# Patient Record
Sex: Female | Born: 1945 | Race: White | Hispanic: No | Smoking: Former smoker
Health system: Southern US, Community
[De-identification: ages and names within clinical notes are randomized; demographics above are authoritative.]

## PROBLEM LIST (undated history)

## (undated) DIAGNOSIS — H9201 Otalgia, right ear: Secondary | ICD-10-CM

## (undated) DIAGNOSIS — H3530 Unspecified macular degeneration: Secondary | ICD-10-CM

## (undated) DIAGNOSIS — K219 Gastro-esophageal reflux disease without esophagitis: Secondary | ICD-10-CM

## (undated) DIAGNOSIS — J45909 Unspecified asthma, uncomplicated: Secondary | ICD-10-CM

## (undated) DIAGNOSIS — R0602 Shortness of breath: Secondary | ICD-10-CM

## (undated) DIAGNOSIS — F419 Anxiety disorder, unspecified: Secondary | ICD-10-CM

## (undated) DIAGNOSIS — M3500 Sicca syndrome, unspecified: Secondary | ICD-10-CM

## (undated) DIAGNOSIS — J189 Pneumonia, unspecified organism: Secondary | ICD-10-CM

## (undated) DIAGNOSIS — F329 Major depressive disorder, single episode, unspecified: Secondary | ICD-10-CM

## (undated) DIAGNOSIS — M329 Systemic lupus erythematosus, unspecified: Secondary | ICD-10-CM

## (undated) DIAGNOSIS — J309 Allergic rhinitis, unspecified: Secondary | ICD-10-CM

## (undated) DIAGNOSIS — H269 Unspecified cataract: Secondary | ICD-10-CM

## (undated) DIAGNOSIS — F32A Depression, unspecified: Secondary | ICD-10-CM

## (undated) HISTORY — PX: NO PAST SURGERIES: SHX2092

## (undated) HISTORY — DX: Allergic rhinitis, unspecified: J30.9

## (undated) HISTORY — DX: Unspecified asthma, uncomplicated: J45.909

## (undated) HISTORY — DX: Otalgia, right ear: H92.01

---

## 1998-06-12 ENCOUNTER — Other Ambulatory Visit: Admission: RE | Admit: 1998-06-12 | Discharge: 1998-06-12 | Payer: Self-pay | Admitting: Family Medicine

## 1999-10-05 ENCOUNTER — Encounter: Admission: RE | Admit: 1999-10-05 | Discharge: 1999-10-05 | Payer: Self-pay | Admitting: Family Medicine

## 1999-10-05 ENCOUNTER — Encounter: Payer: Self-pay | Admitting: Family Medicine

## 1999-10-15 ENCOUNTER — Encounter: Admission: RE | Admit: 1999-10-15 | Discharge: 1999-10-15 | Payer: Self-pay | Admitting: Family Medicine

## 1999-10-15 ENCOUNTER — Encounter: Payer: Self-pay | Admitting: Family Medicine

## 2000-10-17 ENCOUNTER — Encounter: Payer: Self-pay | Admitting: Family Medicine

## 2000-10-17 ENCOUNTER — Encounter: Admission: RE | Admit: 2000-10-17 | Discharge: 2000-10-17 | Payer: Self-pay | Admitting: Family Medicine

## 2002-01-20 ENCOUNTER — Ambulatory Visit (HOSPITAL_COMMUNITY): Admission: RE | Admit: 2002-01-20 | Discharge: 2002-01-20 | Payer: Self-pay | Admitting: Gastroenterology

## 2004-05-03 ENCOUNTER — Ambulatory Visit: Payer: Self-pay | Admitting: Internal Medicine

## 2004-05-09 ENCOUNTER — Ambulatory Visit: Payer: Self-pay | Admitting: Internal Medicine

## 2004-05-22 ENCOUNTER — Ambulatory Visit: Payer: Self-pay | Admitting: Internal Medicine

## 2004-05-31 ENCOUNTER — Ambulatory Visit: Payer: Self-pay | Admitting: Internal Medicine

## 2004-06-08 ENCOUNTER — Ambulatory Visit: Payer: Self-pay | Admitting: Internal Medicine

## 2004-06-25 ENCOUNTER — Ambulatory Visit: Payer: Self-pay | Admitting: Internal Medicine

## 2004-07-18 ENCOUNTER — Ambulatory Visit: Payer: Self-pay | Admitting: Internal Medicine

## 2004-08-10 ENCOUNTER — Ambulatory Visit: Payer: Self-pay | Admitting: Internal Medicine

## 2004-08-24 ENCOUNTER — Ambulatory Visit: Payer: Self-pay | Admitting: Internal Medicine

## 2004-08-29 ENCOUNTER — Ambulatory Visit: Payer: Self-pay | Admitting: Internal Medicine

## 2004-10-17 ENCOUNTER — Ambulatory Visit: Payer: Self-pay | Admitting: Internal Medicine

## 2004-10-25 ENCOUNTER — Ambulatory Visit: Payer: Self-pay | Admitting: Internal Medicine

## 2004-11-02 ENCOUNTER — Ambulatory Visit: Payer: Self-pay | Admitting: Internal Medicine

## 2004-11-19 ENCOUNTER — Ambulatory Visit: Payer: Self-pay | Admitting: Internal Medicine

## 2004-11-30 ENCOUNTER — Ambulatory Visit: Payer: Self-pay | Admitting: Internal Medicine

## 2004-12-10 ENCOUNTER — Ambulatory Visit: Payer: Self-pay | Admitting: Internal Medicine

## 2005-01-04 ENCOUNTER — Ambulatory Visit: Payer: Self-pay | Admitting: Internal Medicine

## 2005-01-10 ENCOUNTER — Ambulatory Visit: Payer: Self-pay | Admitting: Internal Medicine

## 2005-01-18 ENCOUNTER — Ambulatory Visit: Payer: Self-pay | Admitting: Internal Medicine

## 2005-02-08 ENCOUNTER — Ambulatory Visit: Payer: Self-pay | Admitting: Internal Medicine

## 2005-02-11 ENCOUNTER — Ambulatory Visit: Payer: Self-pay | Admitting: Internal Medicine

## 2005-02-13 ENCOUNTER — Ambulatory Visit: Payer: Self-pay | Admitting: Internal Medicine

## 2005-02-27 ENCOUNTER — Ambulatory Visit: Payer: Self-pay | Admitting: Internal Medicine

## 2005-03-08 ENCOUNTER — Ambulatory Visit: Payer: Self-pay | Admitting: Internal Medicine

## 2005-03-22 ENCOUNTER — Ambulatory Visit: Payer: Self-pay | Admitting: Internal Medicine

## 2005-03-29 ENCOUNTER — Ambulatory Visit: Payer: Self-pay | Admitting: Internal Medicine

## 2005-04-08 ENCOUNTER — Ambulatory Visit: Payer: Self-pay | Admitting: Internal Medicine

## 2005-04-26 ENCOUNTER — Ambulatory Visit: Payer: Self-pay | Admitting: Internal Medicine

## 2005-05-03 ENCOUNTER — Ambulatory Visit: Payer: Self-pay | Admitting: Internal Medicine

## 2005-05-10 ENCOUNTER — Ambulatory Visit: Payer: Self-pay | Admitting: Internal Medicine

## 2005-05-17 ENCOUNTER — Ambulatory Visit: Payer: Self-pay | Admitting: Internal Medicine

## 2005-05-29 ENCOUNTER — Ambulatory Visit: Payer: Self-pay | Admitting: Internal Medicine

## 2005-06-13 ENCOUNTER — Ambulatory Visit: Payer: Self-pay | Admitting: Internal Medicine

## 2005-06-28 ENCOUNTER — Ambulatory Visit: Payer: Self-pay | Admitting: Internal Medicine

## 2005-07-11 ENCOUNTER — Ambulatory Visit: Payer: Self-pay | Admitting: Internal Medicine

## 2005-07-25 ENCOUNTER — Ambulatory Visit: Payer: Self-pay | Admitting: Internal Medicine

## 2005-07-29 ENCOUNTER — Ambulatory Visit: Payer: Self-pay | Admitting: Internal Medicine

## 2005-07-31 ENCOUNTER — Ambulatory Visit: Payer: Self-pay | Admitting: Internal Medicine

## 2005-08-05 ENCOUNTER — Ambulatory Visit: Payer: Self-pay | Admitting: Internal Medicine

## 2005-08-29 ENCOUNTER — Ambulatory Visit: Payer: Self-pay | Admitting: Internal Medicine

## 2005-09-13 ENCOUNTER — Ambulatory Visit: Payer: Self-pay | Admitting: Internal Medicine

## 2005-10-15 ENCOUNTER — Ambulatory Visit: Payer: Self-pay | Admitting: Internal Medicine

## 2005-10-29 ENCOUNTER — Ambulatory Visit: Payer: Self-pay | Admitting: Internal Medicine

## 2005-11-05 ENCOUNTER — Ambulatory Visit: Payer: Self-pay | Admitting: Internal Medicine

## 2005-11-26 ENCOUNTER — Ambulatory Visit: Payer: Self-pay | Admitting: Internal Medicine

## 2005-12-11 ENCOUNTER — Ambulatory Visit: Payer: Self-pay | Admitting: Internal Medicine

## 2005-12-17 ENCOUNTER — Ambulatory Visit: Payer: Self-pay | Admitting: Internal Medicine

## 2005-12-26 ENCOUNTER — Ambulatory Visit: Payer: Self-pay | Admitting: Internal Medicine

## 2006-01-17 ENCOUNTER — Ambulatory Visit: Payer: Self-pay | Admitting: Internal Medicine

## 2006-01-31 ENCOUNTER — Ambulatory Visit: Payer: Self-pay | Admitting: Internal Medicine

## 2006-02-17 ENCOUNTER — Ambulatory Visit: Payer: Self-pay | Admitting: Internal Medicine

## 2006-02-25 ENCOUNTER — Ambulatory Visit: Payer: Self-pay | Admitting: Internal Medicine

## 2006-02-26 ENCOUNTER — Ambulatory Visit: Payer: Self-pay | Admitting: Internal Medicine

## 2006-03-10 ENCOUNTER — Ambulatory Visit: Payer: Self-pay | Admitting: Internal Medicine

## 2006-03-26 ENCOUNTER — Ambulatory Visit: Payer: Self-pay | Admitting: Internal Medicine

## 2006-04-11 ENCOUNTER — Ambulatory Visit: Payer: Self-pay | Admitting: Internal Medicine

## 2006-05-09 ENCOUNTER — Ambulatory Visit: Payer: Self-pay | Admitting: Internal Medicine

## 2006-05-21 ENCOUNTER — Ambulatory Visit: Payer: Self-pay | Admitting: Internal Medicine

## 2006-06-05 ENCOUNTER — Ambulatory Visit: Payer: Self-pay | Admitting: Internal Medicine

## 2006-07-04 ENCOUNTER — Ambulatory Visit: Payer: Self-pay | Admitting: Internal Medicine

## 2006-07-10 ENCOUNTER — Ambulatory Visit: Payer: Self-pay | Admitting: Internal Medicine

## 2006-08-01 ENCOUNTER — Ambulatory Visit: Payer: Self-pay | Admitting: Internal Medicine

## 2006-08-08 ENCOUNTER — Ambulatory Visit: Payer: Self-pay | Admitting: Internal Medicine

## 2006-09-12 ENCOUNTER — Ambulatory Visit: Payer: Self-pay | Admitting: Internal Medicine

## 2006-09-19 ENCOUNTER — Ambulatory Visit: Payer: Self-pay | Admitting: Internal Medicine

## 2006-09-24 ENCOUNTER — Ambulatory Visit: Payer: Self-pay | Admitting: Internal Medicine

## 2006-10-06 ENCOUNTER — Ambulatory Visit: Payer: Self-pay | Admitting: Internal Medicine

## 2006-10-16 ENCOUNTER — Ambulatory Visit: Payer: Self-pay | Admitting: Internal Medicine

## 2006-10-23 ENCOUNTER — Ambulatory Visit: Payer: Self-pay | Admitting: Internal Medicine

## 2006-10-27 ENCOUNTER — Ambulatory Visit: Payer: Self-pay | Admitting: Internal Medicine

## 2006-10-29 ENCOUNTER — Ambulatory Visit: Payer: Self-pay | Admitting: Internal Medicine

## 2006-11-11 ENCOUNTER — Ambulatory Visit: Payer: Self-pay | Admitting: Internal Medicine

## 2006-11-11 ENCOUNTER — Encounter: Admission: RE | Admit: 2006-11-11 | Discharge: 2006-11-11 | Payer: Self-pay | Admitting: Family Medicine

## 2006-12-02 ENCOUNTER — Ambulatory Visit: Payer: Self-pay | Admitting: Internal Medicine

## 2006-12-10 ENCOUNTER — Ambulatory Visit: Payer: Self-pay | Admitting: Internal Medicine

## 2006-12-19 ENCOUNTER — Ambulatory Visit: Payer: Self-pay | Admitting: Internal Medicine

## 2007-01-02 ENCOUNTER — Ambulatory Visit: Payer: Self-pay | Admitting: Internal Medicine

## 2007-01-23 ENCOUNTER — Ambulatory Visit: Payer: Self-pay | Admitting: Internal Medicine

## 2007-01-30 ENCOUNTER — Ambulatory Visit: Payer: Self-pay | Admitting: Internal Medicine

## 2007-01-30 ENCOUNTER — Ambulatory Visit: Payer: Self-pay | Admitting: Pulmonary Disease

## 2007-02-27 ENCOUNTER — Ambulatory Visit: Payer: Self-pay | Admitting: Internal Medicine

## 2007-03-09 ENCOUNTER — Ambulatory Visit: Payer: Self-pay | Admitting: Internal Medicine

## 2007-03-27 ENCOUNTER — Ambulatory Visit: Payer: Self-pay | Admitting: Internal Medicine

## 2007-04-01 ENCOUNTER — Ambulatory Visit: Payer: Self-pay | Admitting: Internal Medicine

## 2007-04-13 ENCOUNTER — Ambulatory Visit: Payer: Self-pay | Admitting: Internal Medicine

## 2007-04-22 ENCOUNTER — Ambulatory Visit: Payer: Self-pay | Admitting: Internal Medicine

## 2007-05-08 ENCOUNTER — Ambulatory Visit: Payer: Self-pay | Admitting: Internal Medicine

## 2007-05-15 ENCOUNTER — Ambulatory Visit: Payer: Self-pay | Admitting: Internal Medicine

## 2007-05-25 ENCOUNTER — Ambulatory Visit: Payer: Self-pay | Admitting: Internal Medicine

## 2007-05-26 ENCOUNTER — Ambulatory Visit: Payer: Self-pay | Admitting: Internal Medicine

## 2007-06-12 ENCOUNTER — Ambulatory Visit: Payer: Self-pay | Admitting: Internal Medicine

## 2007-07-03 ENCOUNTER — Ambulatory Visit: Payer: Self-pay | Admitting: Internal Medicine

## 2007-08-10 ENCOUNTER — Ambulatory Visit: Payer: Self-pay | Admitting: Internal Medicine

## 2007-08-21 ENCOUNTER — Ambulatory Visit: Payer: Self-pay | Admitting: Internal Medicine

## 2007-09-03 ENCOUNTER — Ambulatory Visit: Payer: Self-pay | Admitting: Internal Medicine

## 2007-10-05 ENCOUNTER — Ambulatory Visit: Payer: Self-pay | Admitting: Internal Medicine

## 2007-10-12 DIAGNOSIS — J309 Allergic rhinitis, unspecified: Secondary | ICD-10-CM | POA: Insufficient documentation

## 2007-10-12 DIAGNOSIS — J45909 Unspecified asthma, uncomplicated: Secondary | ICD-10-CM

## 2007-10-13 ENCOUNTER — Ambulatory Visit: Payer: Self-pay | Admitting: Internal Medicine

## 2007-10-22 ENCOUNTER — Ambulatory Visit: Payer: Self-pay | Admitting: Internal Medicine

## 2007-10-27 ENCOUNTER — Ambulatory Visit: Payer: Self-pay | Admitting: Internal Medicine

## 2007-11-06 ENCOUNTER — Ambulatory Visit: Payer: Self-pay | Admitting: Internal Medicine

## 2007-11-10 ENCOUNTER — Ambulatory Visit: Payer: Self-pay | Admitting: Internal Medicine

## 2007-11-17 ENCOUNTER — Ambulatory Visit: Payer: Self-pay | Admitting: Internal Medicine

## 2007-11-24 ENCOUNTER — Ambulatory Visit: Payer: Self-pay | Admitting: Internal Medicine

## 2007-12-03 ENCOUNTER — Ambulatory Visit: Payer: Self-pay | Admitting: Internal Medicine

## 2007-12-10 ENCOUNTER — Ambulatory Visit: Payer: Self-pay | Admitting: Internal Medicine

## 2007-12-25 ENCOUNTER — Ambulatory Visit: Payer: Self-pay | Admitting: Internal Medicine

## 2007-12-31 ENCOUNTER — Ambulatory Visit: Payer: Self-pay | Admitting: Internal Medicine

## 2008-01-01 ENCOUNTER — Ambulatory Visit: Payer: Self-pay | Admitting: Internal Medicine

## 2008-01-08 ENCOUNTER — Ambulatory Visit: Payer: Self-pay | Admitting: Internal Medicine

## 2008-01-14 ENCOUNTER — Ambulatory Visit: Payer: Self-pay | Admitting: Internal Medicine

## 2008-01-15 ENCOUNTER — Ambulatory Visit: Payer: Self-pay | Admitting: Pulmonary Disease

## 2008-01-19 ENCOUNTER — Ambulatory Visit: Payer: Self-pay | Admitting: Internal Medicine

## 2008-01-26 ENCOUNTER — Ambulatory Visit: Payer: Self-pay | Admitting: Internal Medicine

## 2008-01-27 ENCOUNTER — Ambulatory Visit: Payer: Self-pay | Admitting: Internal Medicine

## 2008-02-03 ENCOUNTER — Ambulatory Visit: Payer: Self-pay | Admitting: Internal Medicine

## 2008-02-11 ENCOUNTER — Ambulatory Visit: Payer: Self-pay | Admitting: Internal Medicine

## 2008-02-18 ENCOUNTER — Ambulatory Visit: Payer: Self-pay | Admitting: Internal Medicine

## 2008-02-25 ENCOUNTER — Ambulatory Visit: Payer: Self-pay | Admitting: Internal Medicine

## 2008-03-10 ENCOUNTER — Ambulatory Visit: Payer: Self-pay | Admitting: Internal Medicine

## 2008-03-24 ENCOUNTER — Ambulatory Visit: Payer: Self-pay | Admitting: Internal Medicine

## 2008-03-28 ENCOUNTER — Ambulatory Visit: Payer: Self-pay | Admitting: Internal Medicine

## 2008-03-29 ENCOUNTER — Ambulatory Visit: Payer: Self-pay | Admitting: Internal Medicine

## 2008-03-29 DIAGNOSIS — J069 Acute upper respiratory infection, unspecified: Secondary | ICD-10-CM | POA: Insufficient documentation

## 2008-04-07 ENCOUNTER — Ambulatory Visit: Payer: Self-pay | Admitting: Internal Medicine

## 2008-04-13 ENCOUNTER — Ambulatory Visit: Payer: Self-pay | Admitting: Internal Medicine

## 2008-04-19 ENCOUNTER — Ambulatory Visit: Payer: Self-pay | Admitting: Internal Medicine

## 2008-04-28 ENCOUNTER — Ambulatory Visit: Payer: Self-pay | Admitting: Internal Medicine

## 2008-05-10 ENCOUNTER — Ambulatory Visit: Payer: Self-pay | Admitting: Internal Medicine

## 2008-05-11 ENCOUNTER — Ambulatory Visit: Payer: Self-pay | Admitting: Internal Medicine

## 2008-05-19 ENCOUNTER — Ambulatory Visit: Payer: Self-pay | Admitting: Internal Medicine

## 2008-05-26 ENCOUNTER — Ambulatory Visit: Payer: Self-pay | Admitting: Internal Medicine

## 2008-06-07 ENCOUNTER — Ambulatory Visit: Payer: Self-pay | Admitting: Internal Medicine

## 2008-06-14 ENCOUNTER — Ambulatory Visit: Payer: Self-pay | Admitting: Internal Medicine

## 2008-06-20 ENCOUNTER — Ambulatory Visit: Payer: Self-pay | Admitting: Internal Medicine

## 2008-07-05 ENCOUNTER — Ambulatory Visit: Payer: Self-pay | Admitting: Internal Medicine

## 2008-07-26 ENCOUNTER — Ambulatory Visit: Payer: Self-pay | Admitting: Internal Medicine

## 2008-08-04 ENCOUNTER — Ambulatory Visit: Payer: Self-pay | Admitting: Internal Medicine

## 2008-08-19 ENCOUNTER — Ambulatory Visit: Payer: Self-pay | Admitting: Internal Medicine

## 2008-09-08 ENCOUNTER — Ambulatory Visit: Payer: Self-pay | Admitting: Internal Medicine

## 2008-09-13 ENCOUNTER — Ambulatory Visit: Payer: Self-pay | Admitting: Internal Medicine

## 2008-09-22 ENCOUNTER — Ambulatory Visit: Payer: Self-pay | Admitting: Internal Medicine

## 2008-10-04 ENCOUNTER — Ambulatory Visit: Payer: Self-pay | Admitting: Internal Medicine

## 2008-10-10 ENCOUNTER — Ambulatory Visit: Payer: Self-pay | Admitting: Internal Medicine

## 2008-10-10 DIAGNOSIS — M25519 Pain in unspecified shoulder: Secondary | ICD-10-CM

## 2008-10-20 ENCOUNTER — Ambulatory Visit: Payer: Self-pay | Admitting: Internal Medicine

## 2008-10-25 ENCOUNTER — Ambulatory Visit: Payer: Self-pay | Admitting: Internal Medicine

## 2008-10-28 ENCOUNTER — Ambulatory Visit: Payer: Self-pay | Admitting: Internal Medicine

## 2008-11-08 ENCOUNTER — Ambulatory Visit: Payer: Self-pay | Admitting: Internal Medicine

## 2008-11-16 ENCOUNTER — Ambulatory Visit: Payer: Self-pay | Admitting: Internal Medicine

## 2008-11-22 ENCOUNTER — Ambulatory Visit: Payer: Self-pay | Admitting: Internal Medicine

## 2008-11-30 ENCOUNTER — Ambulatory Visit: Payer: Self-pay | Admitting: Internal Medicine

## 2008-12-08 ENCOUNTER — Ambulatory Visit: Payer: Self-pay | Admitting: Internal Medicine

## 2008-12-21 ENCOUNTER — Ambulatory Visit: Payer: Self-pay | Admitting: Internal Medicine

## 2008-12-29 ENCOUNTER — Ambulatory Visit: Payer: Self-pay | Admitting: Internal Medicine

## 2009-02-03 ENCOUNTER — Ambulatory Visit: Payer: Self-pay | Admitting: Internal Medicine

## 2009-02-10 ENCOUNTER — Ambulatory Visit: Payer: Self-pay | Admitting: Internal Medicine

## 2009-02-15 ENCOUNTER — Ambulatory Visit: Payer: Self-pay | Admitting: Internal Medicine

## 2009-02-28 ENCOUNTER — Ambulatory Visit: Payer: Self-pay | Admitting: Internal Medicine

## 2009-03-08 ENCOUNTER — Ambulatory Visit: Payer: Self-pay | Admitting: Internal Medicine

## 2009-03-28 ENCOUNTER — Ambulatory Visit: Payer: Self-pay | Admitting: Internal Medicine

## 2009-04-05 ENCOUNTER — Ambulatory Visit: Payer: Self-pay | Admitting: Internal Medicine

## 2009-04-27 ENCOUNTER — Ambulatory Visit: Payer: Self-pay | Admitting: Internal Medicine

## 2009-04-28 ENCOUNTER — Ambulatory Visit: Payer: Self-pay | Admitting: Internal Medicine

## 2009-05-09 ENCOUNTER — Ambulatory Visit: Payer: Self-pay | Admitting: Internal Medicine

## 2009-05-18 ENCOUNTER — Ambulatory Visit: Payer: Self-pay | Admitting: Internal Medicine

## 2009-05-30 ENCOUNTER — Ambulatory Visit: Payer: Self-pay | Admitting: Internal Medicine

## 2009-06-12 ENCOUNTER — Ambulatory Visit: Payer: Self-pay | Admitting: Internal Medicine

## 2009-06-30 ENCOUNTER — Ambulatory Visit: Payer: Self-pay | Admitting: Internal Medicine

## 2009-07-12 ENCOUNTER — Ambulatory Visit: Payer: Self-pay | Admitting: Internal Medicine

## 2009-07-21 ENCOUNTER — Ambulatory Visit: Payer: Self-pay | Admitting: Internal Medicine

## 2009-08-02 ENCOUNTER — Ambulatory Visit: Payer: Self-pay | Admitting: Internal Medicine

## 2009-08-07 ENCOUNTER — Ambulatory Visit: Payer: Self-pay | Admitting: Internal Medicine

## 2009-08-23 ENCOUNTER — Ambulatory Visit: Payer: Self-pay | Admitting: Internal Medicine

## 2009-08-31 ENCOUNTER — Ambulatory Visit: Payer: Self-pay | Admitting: Internal Medicine

## 2009-09-14 ENCOUNTER — Ambulatory Visit: Payer: Self-pay | Admitting: Internal Medicine

## 2009-09-21 ENCOUNTER — Ambulatory Visit: Payer: Self-pay | Admitting: Internal Medicine

## 2009-09-25 ENCOUNTER — Ambulatory Visit: Payer: Self-pay | Admitting: Internal Medicine

## 2009-10-04 ENCOUNTER — Ambulatory Visit: Payer: Self-pay | Admitting: Internal Medicine

## 2009-10-11 ENCOUNTER — Telehealth: Payer: Self-pay | Admitting: Internal Medicine

## 2009-10-13 ENCOUNTER — Ambulatory Visit: Payer: Self-pay | Admitting: Internal Medicine

## 2009-10-16 ENCOUNTER — Ambulatory Visit: Payer: Self-pay | Admitting: Internal Medicine

## 2009-10-16 DIAGNOSIS — H9209 Otalgia, unspecified ear: Secondary | ICD-10-CM | POA: Insufficient documentation

## 2009-10-17 ENCOUNTER — Ambulatory Visit: Payer: Self-pay | Admitting: Internal Medicine

## 2009-10-25 ENCOUNTER — Encounter: Admission: RE | Admit: 2009-10-25 | Discharge: 2009-10-25 | Payer: Self-pay | Admitting: Family Medicine

## 2009-10-25 ENCOUNTER — Ambulatory Visit: Payer: Self-pay | Admitting: Internal Medicine

## 2009-11-07 ENCOUNTER — Ambulatory Visit: Payer: Self-pay | Admitting: Internal Medicine

## 2009-11-21 ENCOUNTER — Ambulatory Visit: Payer: Self-pay | Admitting: Internal Medicine

## 2009-12-01 ENCOUNTER — Ambulatory Visit: Payer: Self-pay | Admitting: Internal Medicine

## 2009-12-07 ENCOUNTER — Ambulatory Visit: Payer: Self-pay | Admitting: Internal Medicine

## 2009-12-14 ENCOUNTER — Ambulatory Visit: Payer: Self-pay | Admitting: Internal Medicine

## 2009-12-26 ENCOUNTER — Ambulatory Visit: Payer: Self-pay | Admitting: Internal Medicine

## 2010-01-09 ENCOUNTER — Ambulatory Visit: Payer: Self-pay | Admitting: Internal Medicine

## 2010-01-29 ENCOUNTER — Ambulatory Visit: Payer: Self-pay | Admitting: Internal Medicine

## 2010-02-09 ENCOUNTER — Ambulatory Visit: Payer: Self-pay | Admitting: Internal Medicine

## 2010-02-14 ENCOUNTER — Ambulatory Visit: Payer: Self-pay | Admitting: Internal Medicine

## 2010-02-20 ENCOUNTER — Ambulatory Visit: Payer: Self-pay | Admitting: Internal Medicine

## 2010-02-27 ENCOUNTER — Ambulatory Visit: Payer: Self-pay | Admitting: Internal Medicine

## 2010-03-06 ENCOUNTER — Ambulatory Visit: Payer: Self-pay | Admitting: Internal Medicine

## 2010-03-12 ENCOUNTER — Ambulatory Visit: Payer: Self-pay | Admitting: Internal Medicine

## 2010-03-22 ENCOUNTER — Ambulatory Visit: Payer: Self-pay | Admitting: Internal Medicine

## 2010-04-02 ENCOUNTER — Ambulatory Visit: Payer: Self-pay | Admitting: Internal Medicine

## 2010-05-10 ENCOUNTER — Ambulatory Visit: Payer: Self-pay | Admitting: Internal Medicine

## 2010-05-13 ENCOUNTER — Encounter: Payer: Self-pay | Admitting: Family Medicine

## 2010-05-15 ENCOUNTER — Ambulatory Visit: Payer: Self-pay | Admitting: Internal Medicine

## 2010-05-22 ENCOUNTER — Ambulatory Visit: Payer: Self-pay | Admitting: Internal Medicine

## 2010-05-22 NOTE — Assessment & Plan Note (Signed)
Summary: FLU SHOT/[facility]   Nurse Visit   Allergies: No Known Drug Allergies  Orders Added: 1)  Admin 1st Vaccine [90471] 2)  Flu Vaccine 100yrs + [98119] Flu Vaccine Consent Questions     Do you have a history of severe allergic reactions to this vaccine? no    Any prior history of allergic reactions to egg and/or gelatin? no    Do you have a sensitivity to the preservative Thimersol? no    Do you have a past history of Guillan-Barre Syndrome? no    Do you currently have an acute febrile illness? no    Have you ever had a severe reaction to latex? no    Vaccine information given and explained to patient? yes    Are you currently pregnant? no    Lot Number:[id number]   Exp Date:10/20/2010   Site Given  Left Deltoid IM  Tammy Scott  February 14, 2010 11:08 AM

## 2010-05-22 NOTE — Miscellaneous (Signed)
Summary: Injection Record / [facility]    Injection Record / [facility]    Imported By: Lennie Odor 12/22/2009 10:00:12  _____________________________________________________________________  External Attachment:    Type:   Image     Comment:   External Document

## 2010-05-22 NOTE — Miscellaneous (Signed)
Summary: Injection Record/[facility]  Injection Record/[facility]   Imported By: Sherian Rein 09/12/2009 13:32:58  _____________________________________________________________________  External Attachment:    Type:   Image     Comment:   External Document

## 2010-05-22 NOTE — Progress Notes (Signed)
Summary: nos appt  Phone Note Call from Patient   Caller: [email or web address]  Call For: young Summary of Call: In ref to nos from 6/21, pt states she will rsc when she comes in for her zolir. Initial call taken by: Darletta Moll,  October 11, 2009 2:19 PM

## 2010-05-22 NOTE — Consult Note (Signed)
Summary: [facility]  [facility]   Imported By: Sherian Rein 11/15/2009 09:11:41  _____________________________________________________________________  External Attachment:    Type:   Image     Comment:   External Document

## 2010-05-22 NOTE — Assessment & Plan Note (Signed)
Summary: rov/ m[facility]   Primary Provider/Referring Provider:  [facility]  CC:  follow up visit-Pain in Right Ear"getting more frequent"..  History of Present Illness: 6/23/009-  65 year old woman returning for follow-up of allergic rhinitis and asthma.  Naproxen has helped her cough.  This has been a good year with no special problems.  Rare need for her rescue inhaler.  Allergy vaccine has done well. Denies headache, sinus drainage, sneezing chest pain, dyspnea, n/v/d, weight loss, fever, edema.   January 27, 2008--Complains of 2 days of right ear pain, slight headache. mild nasal congestion/drainage. Denies chest pain, dyspnea, orthopnea, hemoptysis, fever, n/v/d, edema. Had recent tooth break in bottom right. Dentist early next month.   March 29, 2008--complains of 2 weeks of cough, congesiton, nasal drip, malaise. Denies chest pain, dyspnea, orthopnea, hemoptysis, fever, n/v/d, edema, recent travel , body aches.   10/10/08- Allergic rhinitis, asthma Breathing now normal for her after rhinitis and bronchitis last winter- fully resolved Allergy- got through Spring pollen with adequate control. Continues allergy vaccine here without problems. Asthma- Has not needed rescue inhaler at all, as she continues Advair.  October 16, 2009- Allergic rhinitis, asthma Reports pain in right ear. She remembers it last year around this time, but more this year. She denies hearing loss, but it is in family. Denies tinnitus. Comes and goes- dull pain, relieved by aspirin, happens once every 2-3 weeks. denies popping. Has broken tooth on that side, xrayed and checked by dentist. Otherwise she says she has been fine. Allergy shots doing well AT 1:10. Continues Advair and has used rescue inhaler only once in several years.    Asthma History    Initial Asthma Severity Rating:    Age range: 12+ years    Symptoms: 0-2 days/week    Nighttime Awakenings: 0-2/month    Interferes w/ normal activity: no limitations  SABA use (not for EIB): 0-2 days/week    Asthma Severity Assessment: Intermittent   Preventive Screening-Counseling & Management  Alcohol-Tobacco     Smoking Status: quit     Year Quit: 2001     Tobacco Counseling: to remain off tobacco products  Current Medications (verified): 1)  Advair Diskus 100-50 Mcg/dose  Misc (Fluticasone-Salmeterol) .... Use One Puff Two Times A Day   Rinse Mouth After Each Use 2)  Zoloft 50 Mg Tabs (Sertraline Hcl) .... Take One Tablet Daily. 3)  Flax Seed Oil 1000 Mg Caps (Flaxseed (Linseed)) .... Take 1 By Mouth Once Daily 4)  Proair Hfa 108 (90 Base) Mcg/act  Aers (Albuterol Sulfate) .... As Needed 5)  Nexium 40 Mg  Cpdr (Esomeprazole Magnesium) .... Take 1 By Mouth Once Daily 6)  Allergy Vaccine 1:10 Gh .... Once Weekly 7)  Vitamin D 1000 Unit Tabs (Cholecalciferol) .... Take 1 By Mouth Once Daily 8)  Align  Caps (Probiotic Product) .... Take 1 By Mouth Once Daily  Allergies (verified): No Known Drug Allergies  Past History:  Past Surgical History: Last updated: 10/10/2008 None  Family History: Last updated: 10/13/2007 Mother-living age 42; heart disease Father- deceased age 80; staph infection following heart surgery Brother- living age 16; heart disease Sister- living age 33  Social History: Last updated: 10/13/2007 Retired Patient states former smoker.  quit 2000 ETOH-occasionally Divorced; no children  Risk Factors: Smoking Status: quit (10/16/2009)  Past Medical History: ASTHMA (ICD-493.90) ALLERGIC RHINITIS (ICD-477.9) Ear pain R  Review of Systems      See HPI  The patient denies shortness of breath with activity, shortness  of breath at rest, productive cough, non-productive cough, coughing up blood, chest pain, irregular heartbeats, acid heartburn, indigestion, loss of appetite, weight change, abdominal pain, difficulty swallowing, sore throat, tooth/dental problems, headaches, nasal congestion/difficulty breathing through  nose, and sneezing.    Vital Signs:  Patient profile:   65 year old female Height:      62 inches Weight:      166 pounds BMI:     30.47 O2 Sat:      95 % on Room air Pulse rate:   92 / minute BP sitting:   130 / 90  (left arm) Cuff size:   regular  Vitals Entered By: Reynaldo Minium CMA (October 16, 2009 9:48 AM)  O2 Flow:  Room air CC: follow up visit-Pain in Right Ear"getting more frequent".   Physical Exam  Additional Exam:  General: A/Ox3; pleasant and cooperative, NAD,  SKIN: no rash, lesions NODES: no lymphadenopathy HEENT: [place]/AT, EOM- WNL, Conjuctivae- clear, PERRLA, TM-WNL, Nose- clear, Throat- clear and wnl, Mallampati III NECK: Supple w/ fair ROM, JVD- none, normal carotid impulses w/o bruits Thyroid- normal to palpation CHEST: Clear to P&A HEART: RRR, no m/g/r heard ABDOMEN: Soft and nl; nml bowel sounds; no organomegaly or masses noted ZOX:WRUE, nl pulses, no edema, no obvious crepitus on light passive movement of left arm at shoulder. NEURO: Grossly intact to observation      Impression & Recommendations:  Problem # 1:  ASTHMA (ICD-493.90) Good control with Advair and allergy vaccine, with virtuallly no need for rescue inhaler  Problem # 2:  ALLERGIC RHINITIS (ICD-477.9)  has done very well with allergy vaccine.  Orders: Est. Patient Level IV (45409)  Problem # 3:  EAR PAIN, RIGHT (ICD-388.70)  We can't get hx to suggest any relation to teeth, jaw or sinuses. She blows her nose more in the spring, which is when her ear seems most likely to bother her. Exam is unremarkable except that she doesnt open jaw very wide, which doesn't hurt but she says has gotten worse as she ages We will send her back to Dr Annalee Genta. Question status of her right TM joint.  Medications Added to Medication List This Visit: 1)  Flax Seed Oil 1000 Mg Caps (Flaxseed (linseed)) .... Take 1 by mouth once daily 2)  Vitamin D 1000 Unit Tabs (Cholecalciferol) .... Take 1 by mouth once  daily 3)  Align Caps (Probiotic product) .... Take 1 by mouth once daily  Other Orders: ENT Referral (ENT)  Patient Instructions: 1)  Please schedule a follow-up appointment in 1 year. 2)  See [facility] for help with referral back to Dr Annalee Genta about your tight jaw and ear pain. 3)  Continue your present treament otherwise- call if needed. Prescriptions: PROAIR HFA 108 (90 BASE) MCG/ACT  AERS (ALBUTEROL SULFATE) as needed  #1 x prn   Entered and Authorized by:   Waymon Budge MD   Signed by:   Waymon Budge MD on 10/16/2009   Method used:   Print then Give to Patient   RxID:   [id number] ADVAIR DISKUS 100-50 MCG/DOSE  MISC (FLUTICASONE-SALMETEROL) use one puff two times a day   rinse mouth after each use  #1 x prn   Entered and Authorized by:   Waymon Budge MD   Signed by:   Waymon Budge MD on 10/16/2009   Method used:   Print then Give to Patient   RxID:   [id number]

## 2010-05-24 NOTE — Miscellaneous (Signed)
Summary: Injection record  Injection record   Imported By: Lester [place] 05/04/2010 12:20:05  _____________________________________________________________________  External Attachment:    Type:   Image     Comment:   External Document

## 2010-05-30 DIAGNOSIS — J301 Allergic rhinitis due to pollen: Secondary | ICD-10-CM

## 2010-06-12 ENCOUNTER — Ambulatory Visit (INDEPENDENT_AMBULATORY_CARE_PROVIDER_SITE_OTHER): Payer: BC Managed Care – PPO

## 2010-06-12 DIAGNOSIS — J301 Allergic rhinitis due to pollen: Secondary | ICD-10-CM

## 2010-06-22 ENCOUNTER — Ambulatory Visit (INDEPENDENT_AMBULATORY_CARE_PROVIDER_SITE_OTHER): Payer: BC Managed Care – PPO

## 2010-06-22 ENCOUNTER — Encounter: Payer: Self-pay | Admitting: Internal Medicine

## 2010-06-22 DIAGNOSIS — J301 Allergic rhinitis due to pollen: Secondary | ICD-10-CM

## 2010-06-28 NOTE — Assessment & Plan Note (Signed)
Summary: ALLERGY/[facility]   Nurse Visit   Allergies: No Known Drug Allergies  Orders Added: 1)  Allergy Injection (1) [95115] 

## 2010-07-10 ENCOUNTER — Telehealth: Payer: Self-pay | Admitting: Internal Medicine

## 2010-07-10 ENCOUNTER — Ambulatory Visit (INDEPENDENT_AMBULATORY_CARE_PROVIDER_SITE_OTHER): Payer: BC Managed Care – PPO

## 2010-07-10 DIAGNOSIS — J301 Allergic rhinitis due to pollen: Secondary | ICD-10-CM

## 2010-07-10 NOTE — Telephone Encounter (Signed)
LMOMTCB

## 2010-07-11 MED ORDER — AZITHROMYCIN 500 MG PO TABS: ORAL_TABLET | ORAL | Status: DC

## 2010-07-11 NOTE — Telephone Encounter (Signed)
Suggest Z pak 

## 2010-07-11 NOTE — Telephone Encounter (Signed)
Spoke with pt and advised that per Dr Maple Hudson we will send zpack to her pharmacy. She verbalized understanding. Rx was sent to [facility] [address].

## 2010-07-11 NOTE — Telephone Encounter (Signed)
Pt called back and she c/o sore throat, post nasal drip, has some head congestion, sneezing, dry cough x 2 days. Pt denies any fever. Pt has been taking benadryl but has had no relief. Pt would like an rx called into [facility] [address]. Dr. Maple Hudson please advise thanks  Pt has KNDA Carver Fila, MA

## 2010-08-01 ENCOUNTER — Ambulatory Visit (INDEPENDENT_AMBULATORY_CARE_PROVIDER_SITE_OTHER): Payer: BC Managed Care – PPO

## 2010-08-01 DIAGNOSIS — J309 Allergic rhinitis, unspecified: Secondary | ICD-10-CM

## 2010-08-09 ENCOUNTER — Ambulatory Visit (INDEPENDENT_AMBULATORY_CARE_PROVIDER_SITE_OTHER): Payer: BC Managed Care – PPO

## 2010-08-09 DIAGNOSIS — J309 Allergic rhinitis, unspecified: Secondary | ICD-10-CM

## 2010-08-15 ENCOUNTER — Ambulatory Visit (INDEPENDENT_AMBULATORY_CARE_PROVIDER_SITE_OTHER): Payer: BC Managed Care – PPO

## 2010-08-15 DIAGNOSIS — J309 Allergic rhinitis, unspecified: Secondary | ICD-10-CM

## 2010-08-24 ENCOUNTER — Ambulatory Visit (INDEPENDENT_AMBULATORY_CARE_PROVIDER_SITE_OTHER): Payer: BC Managed Care – PPO

## 2010-08-24 DIAGNOSIS — J309 Allergic rhinitis, unspecified: Secondary | ICD-10-CM

## 2010-08-31 ENCOUNTER — Ambulatory Visit (INDEPENDENT_AMBULATORY_CARE_PROVIDER_SITE_OTHER): Payer: BC Managed Care – PPO

## 2010-08-31 DIAGNOSIS — J309 Allergic rhinitis, unspecified: Secondary | ICD-10-CM

## 2010-09-04 NOTE — Assessment & Plan Note (Signed)
[name]                             PULMONARY OFFICE NOTE   NAME:[name], [name] [name]                        MRN:          [id number]  DATE:01/30/2007                            DOB:          1945-12-30    HISTORY OF PRESENT ILLNESS:  The patient is a 65 year old white female  patient of Dr. Roxy [name] who has a known history of asthma and allergic  rhinitis.  Presents today for acute office visit.  Patient complains of  a 2 week history of nasal congestion, post nasal drip, productive cough  with painful coughing paroxysms.  Patient was seen at a local [facility]  1 week ago and given a Z-Pak.  Patient reports that her symptoms did  slightly improve.  She has had no further purulent sputum or fever.  Patient denies any hemoptysis, orthopnea, PND or leg swelling.   PAST MEDICAL HISTORY:  Reviewed.   CURRENT MEDICATIONS:  Reviewed.   PHYSICAL EXAMINATION:  Patient is a pleasant female in no acute  distress.  She is afebrile with stable vital signs.  O2 saturation is  97% on room air.  HEENT:  Nasal mucosa slightly pale.  Nontender sinuses.  Posterior  pharynx was clear.  NECK:  Supple without cervical adenopathy.  No JVD.  LUNGS:  Diminished breath sounds at the bases.  CARDIAC:  Regular rate.  ABDOMEN:  Soft and nontender.  EXTREMITIES:  Warm without any edema.   IMPRESSION AND PLAN:  Unresolved tracheobronchitis.   Patient is to begin Mucinex DM twice daily.  Xopenex nebulizer treatment  was given today in the office.  Short prednisone taper over the next  week.   Zyrtec 10 mg at bedtime x1 week.  Patient is to hold fish oil until  cough is resolved.  Patient is to return back with Dr. Maple [name] in 1=2  months as scheduled or sooner if needed.      Rubye Oaks, NP  Electronically Signed      [name] D. [name] Hudson, MD, Tonny Bollman, FACP  Electronically Signed   TP/MedQ  DD: 01/30/2007  DT: 01/31/2007  Job #: [id number]

## 2010-09-04 NOTE — Assessment & Plan Note (Signed)
[facility]                             PULMONARY OFFICE NOTE   NAME:[name], [name] [name]                        MRN:          [id number]  DATE:10/23/2006                            DOB:          October 25, 1945    PROBLEM:  1. Asthma.  2. Allergic rhinitis.   HISTORY:  She had what she called a summer cold around the 1st week of  June leaving her with some lingering cough, a little bit of wheeze,  scant white sputum.  No sinus pain.  Cough and the feeling of something  in her throat occasionally wake her, but she has not recognized reflux  or heartburn.  She does tend to be somewhat worse lying in bed at night.  She just finished 5 days of doxycycline for a small puncture wound on  her arm.  She continues allergy vaccine at 1:10 without problems.   MEDICATIONS:  1. Advair 100/50.  2. Zoloft.  3. Albuterol inhaler.   NO MEDICATION ALLERGY.   OBJECTIVE:  Weight 178 pounds.  BP 158/98.  Pulse regular 90.  Room air  saturation 96%.  Quiet clear chest.  No cough demonstrated.  No visible postnasal drainage or stridor.  Mild nasal stuffiness.  HEART:  Sounds regular without murmur.   IMPRESSION:  Postviral bronchitis versus reflux.   PLAN:  1. Try Naprosyn 250 mg b.i.d. to see if that will resolve the post      bronchitic bronchitis component as explained to her.  2. Schedule return in 6 months, earlier p.r.n.     Clinton D. Maple Hudson, MD, Tonny Bollman, FACP  Electronically Signed    CDY/MedQ  DD: 10/23/2006  DT: 10/23/2006  Job #: [id number]   cc:   [facility]

## 2010-09-06 ENCOUNTER — Ambulatory Visit (INDEPENDENT_AMBULATORY_CARE_PROVIDER_SITE_OTHER): Payer: BC Managed Care – PPO

## 2010-09-06 DIAGNOSIS — J309 Allergic rhinitis, unspecified: Secondary | ICD-10-CM

## 2010-09-07 NOTE — Assessment & Plan Note (Signed)
[facility]                               PULMONARY OFFICE NOTE   NAME:[name] [name], [name] [name]                        MRN:          [id number]  DATE:03/10/2006                            DOB:          24-Jan-1946    PROBLEM LIST:  1. Asthma.  2. Allergic rhinitis.   HISTORY:  One week of increased cough. She questions if she might have a  sinus infection. Coughs sometimes until she wretches but brings up only  clear sputum. Tussive rib soreness, not sleeping well, wheeze. She does not  have a sore throat but head feels congested. 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 Continues  allergy vaccine here at 1:10.   MEDICATIONS:  1. Advair 100/50.  2. Astelin.  3. Zoloft.  4. Lisinopril.  5. Albuterol inhaler.   No medication allergy.   She had not been coughing routinely but we did look at the lisinopril  question.   OBJECTIVE:  Weight 184 pounds, BP 122/86, pulse regular 100, room air  saturation 99%. Turbinate edema. Chest sounds clear. Throat is not red.  There is no adenopathy or drainage.   Chest x-ray from June 26 had shown stable mild chronic bronchitis changes  with no acute abnormality. Chemistries and labs from that time had been  remarkable only for a platelet count of 456,000.   IMPRESSION:  Bronchitis and rhinitis with exacerbation.   PLAN:  1. Nasal nebulizer treatment with Neo-Synephrine.  2. Depo-Medrol 80 mg IM.  3. Doxicycline for 7 days to hold with discussion.  4. Phenergan with codeine cough syrup 5 mL q.6h p.r.n.  5. Schedule return in 6 months, earlier p.r.n.    Clinton D. Maple Hudson, MD, Tonny Bollman, FACP  Electronically Signed   CDY/MedQ  DD: 03/11/2006  DT: 03/12/2006  Job #: [id number]

## 2010-09-07 NOTE — Op Note (Signed)
NAMEJANYLA, [name] [name]                           ACCOUNT NO.:  [id number]   MEDICAL RECORD NO.:  [id number]                   PATIENT TYPE:  AMB   LOCATION:  ENDO                                 FACILITY:  [facility]   PHYSICIAN:  Charna Elizabeth, M.D.                   DATE OF BIRTH:  09/19/1945   DATE OF PROCEDURE:  01/20/2002  DATE OF DISCHARGE:                                 OPERATIVE REPORT   PROCEDURE PERFORMED:  Screening colonoscopy.   ENDOSCOPIST:  Charna Elizabeth, M.D.   INSTRUMENT USED:  Olympus video colonoscope.   INDICATIONS FOR PROCEDURE:  The patient is a 65 year old white female  undergoing screening colonoscopy.  The patient has had some rectal bleeding  with hard stool and has a history of chronic constipation and family history  of colon cancer in a paternal uncle.  Rule out colonic polyps, masses,  hemorrhoids, etc.   PREPROCEDURE PREPARATION:  Informed consent was procured from the patient.  The patient was fasted for eight hours prior to the procedure and prepped  with a bottle of magnesium citrate and a gallon of NuLytely the night prior  to the procedure.   PREPROCEDURE PHYSICAL:  The patient had stable vital signs. Neck supple.  Chest clear to auscultation.  S1 and S2 regular.  Abdomen soft with normal  bowel sounds.   DESCRIPTION OF PROCEDURE:  The patient was placed in left lateral decubitus  position and sedated with 70 mg of Demerol and 7 mg of Versed intravenously.  Once the patient was adequately sedated and maintained on low flow oxygen  and continuous cardiac monitoring, the Olympus video colonoscope was  advanced from the rectum to the cecum and terminal ileum without difficulty.  An isolated diverticulum was seen in the left colon.  The rest of the  colonic mucosa up to the terminal ileum appeared normal.   IMPRESSION:  1. Normal colonoscopy up to the terminal ileum except for an isolated     diverticulum in the left colon.  2. No masses or polyps  seen.  3. No evidence of hemorrhoids.   RECOMMENDATIONS:  1. A high fiber diet has been discussed with the patient and dietary     brochures have been given to her for her education.  2.     Repeat colorectal cancer screening is recommended in the next five years     unless the patient develops any abnormal symptoms in the interim.  3. Outpatient follow-up in the next two weeks earlier if need be.                                                    Charna Elizabeth, M.D.    JM/MEDQ  D:  01/20/2002  T:  01/20/2002  Job:  [id number]   cc:   Teena Irani. Arlyce Dice, M.D.

## 2010-09-13 ENCOUNTER — Ambulatory Visit (INDEPENDENT_AMBULATORY_CARE_PROVIDER_SITE_OTHER): Payer: BC Managed Care – PPO

## 2010-09-13 DIAGNOSIS — J309 Allergic rhinitis, unspecified: Secondary | ICD-10-CM

## 2010-09-25 ENCOUNTER — Ambulatory Visit (INDEPENDENT_AMBULATORY_CARE_PROVIDER_SITE_OTHER): Payer: BC Managed Care – PPO

## 2010-09-25 DIAGNOSIS — J309 Allergic rhinitis, unspecified: Secondary | ICD-10-CM

## 2010-10-08 ENCOUNTER — Ambulatory Visit (INDEPENDENT_AMBULATORY_CARE_PROVIDER_SITE_OTHER): Payer: BC Managed Care – PPO

## 2010-10-08 DIAGNOSIS — J309 Allergic rhinitis, unspecified: Secondary | ICD-10-CM

## 2010-10-10 ENCOUNTER — Encounter: Payer: Self-pay | Admitting: Internal Medicine

## 2010-10-17 ENCOUNTER — Ambulatory Visit (INDEPENDENT_AMBULATORY_CARE_PROVIDER_SITE_OTHER): Payer: BC Managed Care – PPO

## 2010-10-17 DIAGNOSIS — J309 Allergic rhinitis, unspecified: Secondary | ICD-10-CM

## 2010-10-18 ENCOUNTER — Ambulatory Visit (INDEPENDENT_AMBULATORY_CARE_PROVIDER_SITE_OTHER): Payer: BC Managed Care – PPO

## 2010-10-18 DIAGNOSIS — J309 Allergic rhinitis, unspecified: Secondary | ICD-10-CM

## 2010-10-19 ENCOUNTER — Ambulatory Visit: Payer: BC Managed Care – PPO

## 2010-10-26 ENCOUNTER — Ambulatory Visit: Payer: BC Managed Care – PPO | Admitting: Internal Medicine

## 2010-11-05 ENCOUNTER — Ambulatory Visit: Payer: BC Managed Care – PPO | Admitting: Internal Medicine

## 2010-11-23 ENCOUNTER — Ambulatory Visit (INDEPENDENT_AMBULATORY_CARE_PROVIDER_SITE_OTHER): Payer: BC Managed Care – PPO

## 2010-11-23 DIAGNOSIS — J309 Allergic rhinitis, unspecified: Secondary | ICD-10-CM

## 2010-11-29 ENCOUNTER — Ambulatory Visit (INDEPENDENT_AMBULATORY_CARE_PROVIDER_SITE_OTHER): Payer: BC Managed Care – PPO

## 2010-11-29 ENCOUNTER — Ambulatory Visit (INDEPENDENT_AMBULATORY_CARE_PROVIDER_SITE_OTHER): Payer: BC Managed Care – PPO | Admitting: Internal Medicine

## 2010-11-29 ENCOUNTER — Encounter: Payer: Self-pay | Admitting: Internal Medicine

## 2010-11-29 VITALS — BP 124/80 | HR 80 | Ht 62.0 in | Wt 162.2 lb

## 2010-11-29 DIAGNOSIS — J45909 Unspecified asthma, uncomplicated: Secondary | ICD-10-CM

## 2010-11-29 DIAGNOSIS — J309 Allergic rhinitis, unspecified: Secondary | ICD-10-CM

## 2010-11-29 DIAGNOSIS — J301 Allergic rhinitis due to pollen: Secondary | ICD-10-CM

## 2010-11-29 DIAGNOSIS — H9209 Otalgia, unspecified ear: Secondary | ICD-10-CM

## 2010-11-29 MED ORDER — FLUTICASONE-SALMETEROL 100-50 MCG/DOSE IN AEPB: 1.0000 | INHALATION_SPRAY | Freq: Two times a day (BID) | RESPIRATORY_TRACT | Status: DC

## 2010-11-29 MED ORDER — ALBUTEROL SULFATE HFA 108 (90 BASE) MCG/ACT IN AERS: 2.0000 | INHALATION_SPRAY | Freq: Four times a day (QID) | RESPIRATORY_TRACT | Status: DC | PRN

## 2010-11-29 NOTE — Assessment & Plan Note (Signed)
Continues allergy vaccine. I think she would be better off using loratadine than her benadryl. Discussed and educated on this.

## 2010-11-29 NOTE — Patient Instructions (Signed)
Script printed for rescue albuterol inhaler "Proair"- ask drug store to put it on hold for you  Script sent for Advair  Continue allergy vaccine  Suggest you try otc antihistamine loratadine/ Claritin

## 2010-11-29 NOTE — Assessment & Plan Note (Signed)
Mild intermittent asthma. We reviewed meds. She will keep a rescue inhaler script at the drug store.

## 2010-11-29 NOTE — Progress Notes (Signed)
  Subjective:    Patient ID: [name] [name], female    DOB: [birth year], 65 y.o.   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 No asthma flares. Admits only nasal congestion if outside at some times of year. Occasionally will supplement with a benadryl.  Continues allergy vaccine without problems. Continues Advair bid, but never needs the rescue inhaler. Review of Systems Constitutional:   No-   weight loss, night sweats, fevers, chills, fatigue, lassitude. HEENT:   No-  headaches, difficulty swallowing, tooth/dental problems, sore throat,       No-  sneezing, itching, ear ache,           + nasal congestion, post nasal drip,  CV:  No-   chest pain, orthopnea, PND, swelling in lower extremities, anasarca, dizziness, palpitations Resp: No-   shortness of breath with exertion or at rest.              No-   productive cough,  No non-productive cough,  No-  coughing up of blood.              No-   change in color of mucus.  No- wheezing.   Skin: No-   rash or lesions. GI:  No-   heartburn, indigestion, abdominal pain, nausea, vomiting, diarrhea,                 change in bowel habits, loss of appetite GU: No-   dysuria, change in color of urine, no urgency or frequency.  No- flank pain. MS:  No-   joint pain or swelling.  No- decreased range of motion.  No- back pain. Neuro- grossly normal to observation, Or:  Psych:  No- change in mood or affect. No depression or anxiety.  No memory loss.     Objective:   Physical Exam General- Alert, Oriented, Affect-appropriate, Distress- none acute Skin- rash-none, lesions- none, excoriation- none Lymphadenopathy- none Head- atraumatic            Eyes- Gross vision intact, PERRLA, conjunctivae clear secretions            Ears- Hearing, canals normal            Nose- Clear, No- Septal dev, mucus, polyps, erosion, perforation             Throat-  Mallampati III , mucosa clear , drainage- none, tonsils- atrophic Neck- flexible , trachea midline, no stridor , thyroid nl, carotid no bruit Chest - symmetrical excursion , unlabored           Heart/CV- RRR , no murmur , no gallop  , no rub, nl s1 s2                           - JVD- none , edema- none, stasis changes- none, varices- none           Lung- clear to P&A, wheeze- none, cough- none , dullness-none, rub- none           Chest wall-  Abd- tender-no, distended-no, bowel sounds-present, HSM- no Br/ Gen/ Rectal- Not done, not indicated Extrem- cyanosis- none, clubbing, none, atrophy- none, strength- nl Neuro- grossly intact to observation         Assessment & Plan:

## 2010-12-07 ENCOUNTER — Ambulatory Visit (INDEPENDENT_AMBULATORY_CARE_PROVIDER_SITE_OTHER): Payer: BC Managed Care – PPO

## 2010-12-07 DIAGNOSIS — J309 Allergic rhinitis, unspecified: Secondary | ICD-10-CM

## 2010-12-12 ENCOUNTER — Ambulatory Visit (INDEPENDENT_AMBULATORY_CARE_PROVIDER_SITE_OTHER): Payer: BC Managed Care – PPO

## 2010-12-12 DIAGNOSIS — J309 Allergic rhinitis, unspecified: Secondary | ICD-10-CM

## 2011-01-15 ENCOUNTER — Ambulatory Visit (INDEPENDENT_AMBULATORY_CARE_PROVIDER_SITE_OTHER): Payer: BC Managed Care – PPO

## 2011-01-15 DIAGNOSIS — J309 Allergic rhinitis, unspecified: Secondary | ICD-10-CM

## 2011-01-30 ENCOUNTER — Ambulatory Visit (INDEPENDENT_AMBULATORY_CARE_PROVIDER_SITE_OTHER): Payer: BC Managed Care – PPO

## 2011-01-30 DIAGNOSIS — J309 Allergic rhinitis, unspecified: Secondary | ICD-10-CM

## 2011-01-30 DIAGNOSIS — Z23 Encounter for immunization: Secondary | ICD-10-CM

## 2011-02-15 ENCOUNTER — Ambulatory Visit (INDEPENDENT_AMBULATORY_CARE_PROVIDER_SITE_OTHER): Payer: BC Managed Care – PPO

## 2011-02-15 DIAGNOSIS — J309 Allergic rhinitis, unspecified: Secondary | ICD-10-CM

## 2011-02-19 ENCOUNTER — Ambulatory Visit (INDEPENDENT_AMBULATORY_CARE_PROVIDER_SITE_OTHER): Payer: BC Managed Care – PPO

## 2011-02-19 DIAGNOSIS — J309 Allergic rhinitis, unspecified: Secondary | ICD-10-CM

## 2011-02-26 ENCOUNTER — Ambulatory Visit (INDEPENDENT_AMBULATORY_CARE_PROVIDER_SITE_OTHER): Payer: BC Managed Care – PPO

## 2011-02-26 DIAGNOSIS — J309 Allergic rhinitis, unspecified: Secondary | ICD-10-CM

## 2011-03-12 ENCOUNTER — Ambulatory Visit (INDEPENDENT_AMBULATORY_CARE_PROVIDER_SITE_OTHER): Payer: BC Managed Care – PPO

## 2011-03-12 DIAGNOSIS — J309 Allergic rhinitis, unspecified: Secondary | ICD-10-CM

## 2011-03-18 ENCOUNTER — Ambulatory Visit (INDEPENDENT_AMBULATORY_CARE_PROVIDER_SITE_OTHER): Payer: BC Managed Care – PPO

## 2011-03-18 DIAGNOSIS — J309 Allergic rhinitis, unspecified: Secondary | ICD-10-CM

## 2011-03-26 ENCOUNTER — Ambulatory Visit (INDEPENDENT_AMBULATORY_CARE_PROVIDER_SITE_OTHER): Payer: Medicare Other

## 2011-03-26 DIAGNOSIS — J309 Allergic rhinitis, unspecified: Secondary | ICD-10-CM

## 2011-04-10 ENCOUNTER — Ambulatory Visit (INDEPENDENT_AMBULATORY_CARE_PROVIDER_SITE_OTHER): Payer: Medicare Other

## 2011-04-10 DIAGNOSIS — J309 Allergic rhinitis, unspecified: Secondary | ICD-10-CM

## 2011-04-19 ENCOUNTER — Encounter: Payer: Self-pay | Admitting: Internal Medicine

## 2011-04-26 ENCOUNTER — Ambulatory Visit (INDEPENDENT_AMBULATORY_CARE_PROVIDER_SITE_OTHER): Payer: Medicare Other

## 2011-04-26 DIAGNOSIS — J309 Allergic rhinitis, unspecified: Secondary | ICD-10-CM

## 2011-05-07 ENCOUNTER — Ambulatory Visit (INDEPENDENT_AMBULATORY_CARE_PROVIDER_SITE_OTHER): Payer: Medicare Other

## 2011-05-07 ENCOUNTER — Telehealth: Payer: Self-pay | Admitting: Internal Medicine

## 2011-05-07 DIAGNOSIS — J309 Allergic rhinitis, unspecified: Secondary | ICD-10-CM

## 2011-05-07 NOTE — Telephone Encounter (Signed)
Per SN: augmentin 875mg  #14, 1 po BID, mucinex 2 po BID plenty of fuids, saline nasal spray 2 sprays q1-2 hours   Called spoke with paitient, advised of SN's recs as stated by SN.  Pt the informed me that she is already taking an abx sulfamethoxazole TMP bid, has 4 days left.  Per SN, this is fine.  Take this until gone and continue with other recs.  Called spoke with patient, advised to continue the abx along with the saline mucinex and fluids.  Pt verbalized her understanding.  Also rec'd for pt to take zyrtec 10mg  qhs to help with the drainage.  Pt to try this.  Pt will keep 1.21.13 appt with CDY and call sooner if symptoms worsen.  Pt verbalized her understanding.

## 2011-05-07 NOTE — Telephone Encounter (Signed)
I spoke with pt and she c/o nasal congestion, cough w/ yellow phlem, PND, sore throat x couple days. Pt denies any fever, chills, sweats, nausea, vomiting. Pt states she has taken "many" OTC antihistamines w/o relief. Pt is requesting to have something called in since she is going out of town tomorrow and will be gone x 1 week. Since CDY has left [place] will forward to doc of the day for recs. Please advise Dr. Kriste Basque, thanks  No Known Allergies

## 2011-05-13 ENCOUNTER — Ambulatory Visit: Payer: Medicare Other | Admitting: Internal Medicine

## 2011-05-16 ENCOUNTER — Ambulatory Visit (INDEPENDENT_AMBULATORY_CARE_PROVIDER_SITE_OTHER): Payer: Medicare Other

## 2011-05-16 DIAGNOSIS — J309 Allergic rhinitis, unspecified: Secondary | ICD-10-CM

## 2011-06-05 ENCOUNTER — Ambulatory Visit (INDEPENDENT_AMBULATORY_CARE_PROVIDER_SITE_OTHER): Payer: Medicare Other

## 2011-06-05 DIAGNOSIS — J309 Allergic rhinitis, unspecified: Secondary | ICD-10-CM

## 2011-06-06 ENCOUNTER — Ambulatory Visit (INDEPENDENT_AMBULATORY_CARE_PROVIDER_SITE_OTHER): Payer: Medicare Other

## 2011-06-06 DIAGNOSIS — J309 Allergic rhinitis, unspecified: Secondary | ICD-10-CM

## 2011-06-12 ENCOUNTER — Ambulatory Visit (INDEPENDENT_AMBULATORY_CARE_PROVIDER_SITE_OTHER): Payer: Medicare Other

## 2011-06-12 DIAGNOSIS — J309 Allergic rhinitis, unspecified: Secondary | ICD-10-CM

## 2011-07-26 DIAGNOSIS — L930 Discoid lupus erythematosus: Secondary | ICD-10-CM | POA: Insufficient documentation

## 2011-08-09 ENCOUNTER — Encounter (HOSPITAL_COMMUNITY): Payer: Self-pay | Admitting: Adult Health

## 2011-08-09 ENCOUNTER — Emergency Department (HOSPITAL_COMMUNITY): Payer: Medicare Other

## 2011-08-09 ENCOUNTER — Inpatient Hospital Stay (HOSPITAL_COMMUNITY)
Admission: EM | Admit: 2011-08-09 | Discharge: 2011-08-11 | DRG: 546 | Disposition: A | Discharge status: Home / Self Care | Payer: Medicare Other | Attending: Internal Medicine | Admitting: Internal Medicine

## 2011-08-09 DIAGNOSIS — M329 Systemic lupus erythematosus, unspecified: Principal | ICD-10-CM | POA: Diagnosis present

## 2011-08-09 DIAGNOSIS — J45909 Unspecified asthma, uncomplicated: Secondary | ICD-10-CM | POA: Diagnosis present

## 2011-08-09 DIAGNOSIS — R918 Other nonspecific abnormal finding of lung field: Secondary | ICD-10-CM | POA: Diagnosis present

## 2011-08-09 DIAGNOSIS — D751 Secondary polycythemia: Secondary | ICD-10-CM | POA: Diagnosis present

## 2011-08-09 DIAGNOSIS — I498 Other specified cardiac arrhythmias: Secondary | ICD-10-CM | POA: Diagnosis present

## 2011-08-09 DIAGNOSIS — R000 Tachycardia, unspecified: Secondary | ICD-10-CM | POA: Diagnosis present

## 2011-08-09 DIAGNOSIS — D150 Benign neoplasm of thymus: Secondary | ICD-10-CM | POA: Diagnosis present

## 2011-08-09 DIAGNOSIS — Z720 Tobacco use: Secondary | ICD-10-CM | POA: Diagnosis present

## 2011-08-09 DIAGNOSIS — R21 Rash and other nonspecific skin eruption: Secondary | ICD-10-CM | POA: Diagnosis present

## 2011-08-09 DIAGNOSIS — E871 Hypo-osmolality and hyponatremia: Secondary | ICD-10-CM | POA: Diagnosis present

## 2011-08-09 DIAGNOSIS — Z87891 Personal history of nicotine dependence: Secondary | ICD-10-CM

## 2011-08-09 DIAGNOSIS — J309 Allergic rhinitis, unspecified: Secondary | ICD-10-CM | POA: Diagnosis present

## 2011-08-09 DIAGNOSIS — D72819 Decreased white blood cell count, unspecified: Secondary | ICD-10-CM | POA: Diagnosis present

## 2011-08-09 HISTORY — DX: Systemic lupus erythematosus, unspecified: M32.9

## 2011-08-09 LAB — CBC
Platelets: 289 10*3/uL (ref 150–400)
RDW: 13.4 % (ref 11.5–15.5)
WBC: 3.8 10*3/uL — ABNORMAL LOW (ref 4.0–10.5)

## 2011-08-09 LAB — COMPREHENSIVE METABOLIC PANEL
AST: 32 U/L (ref 0–37)
Albumin: 3.6 g/dL (ref 3.5–5.2)
Alkaline Phosphatase: 65 U/L (ref 39–117)
Chloride: 100 mEq/L (ref 96–112)
Potassium: 3.6 mEq/L (ref 3.5–5.1)
Total Bilirubin: 0.3 mg/dL (ref 0.3–1.2)

## 2011-08-09 LAB — URINALYSIS, ROUTINE W REFLEX MICROSCOPIC
Glucose, UA: NEGATIVE mg/dL
Hgb urine dipstick: NEGATIVE
Ketones, ur: 15 mg/dL — AB
Protein, ur: NEGATIVE mg/dL
Urobilinogen, UA: 1 mg/dL (ref 0.0–1.0)

## 2011-08-09 LAB — PROTIME-INR: INR: 1 (ref 0.00–1.49)

## 2011-08-09 LAB — CK TOTAL AND CKMB (NOT AT ARMC)
CK, MB: 1.3 ng/mL (ref 0.3–4.0)
Relative Index: INVALID (ref 0.0–2.5)
Total CK: 46 U/L (ref 7–177)

## 2011-08-09 LAB — TROPONIN I: Troponin I: <0.3 ng/mL (ref <0.30)

## 2011-08-09 MED ORDER — ACETAMINOPHEN 325 MG PO TABS: 650.0000 mg | ORAL_TABLET | Freq: Four times a day (QID) | ORAL | Status: DC | PRN

## 2011-08-09 MED ORDER — DIPHENHYDRAMINE HCL 25 MG PO TABS
25.0000 mg | ORAL_TABLET | Freq: Four times a day (QID) | ORAL | Status: DC | PRN
  Administered 2011-08-10: 25 mg via ORAL
  Filled 2011-08-09 (×2): qty 1

## 2011-08-09 MED ORDER — HYDROXYZINE HCL 25 MG PO TABS
25.0000 mg | ORAL_TABLET | Freq: Four times a day (QID) | ORAL | Status: DC | PRN
  Administered 2011-08-09: 25 mg via ORAL
  Filled 2011-08-09: qty 1

## 2011-08-09 MED ORDER — TRIAMCINOLONE ACETONIDE 0.1 % EX CREA: 1.0000 "application " | TOPICAL_CREAM | Freq: Every day | CUTANEOUS | Status: DC | PRN

## 2011-08-09 MED ORDER — ACETAMINOPHEN 650 MG RE SUPP: 650.0000 mg | Freq: Four times a day (QID) | RECTAL | Status: DC | PRN

## 2011-08-09 MED ORDER — SODIUM CHLORIDE 0.9 % IV SOLN
1000.0000 mL | INTRAVENOUS | Status: DC
  Administered 2011-08-09: 1000 mL via INTRAVENOUS

## 2011-08-09 MED ORDER — METHYLPREDNISOLONE SODIUM SUCC 125 MG IJ SOLR
60.0000 mg | Freq: Four times a day (QID) | INTRAMUSCULAR | Status: DC
  Administered 2011-08-09 – 2011-08-11 (×8): 60 mg via INTRAVENOUS
  Filled 2011-08-09 (×3): qty 0.96
  Filled 2011-08-09: qty 2
  Filled 2011-08-09 (×7): qty 0.96

## 2011-08-09 MED ORDER — SODIUM CHLORIDE 0.9 % IV SOLN: 1000.0000 mL | INTRAVENOUS | Status: AC

## 2011-08-09 MED ORDER — HYDROXYCHLOROQUINE SULFATE 200 MG PO TABS
200.0000 mg | ORAL_TABLET | Freq: Two times a day (BID) | ORAL | Status: DC
  Administered 2011-08-09 – 2011-08-11 (×4): 200 mg via ORAL
  Filled 2011-08-09 (×5): qty 1

## 2011-08-09 MED ORDER — DIPHENHYDRAMINE HCL 50 MG/ML IJ SOLN
25.0000 mg | Freq: Four times a day (QID) | INTRAMUSCULAR | Status: DC
  Administered 2011-08-09 – 2011-08-11 (×8): 25 mg via INTRAVENOUS
  Filled 2011-08-09 (×7): qty 0.5
  Filled 2011-08-09: qty 1
  Filled 2011-08-09: qty 0.5
  Filled 2011-08-09 (×2): qty 1

## 2011-08-09 MED ORDER — SERTRALINE HCL 100 MG PO TABS
100.0000 mg | ORAL_TABLET | Freq: Every day | ORAL | Status: DC
  Administered 2011-08-09 – 2011-08-11 (×3): 100 mg via ORAL
  Filled 2011-08-09 (×3): qty 1

## 2011-08-09 MED ORDER — IOHEXOL 300 MG/ML  SOLN
100.0000 mL | Freq: Once | INTRAMUSCULAR | Status: AC | PRN
  Administered 2011-08-09: 100 mL via INTRAVENOUS

## 2011-08-09 MED ORDER — NITROGLYCERIN 2 % TD OINT: 1.0000 [in_us] | TOPICAL_OINTMENT | Freq: Four times a day (QID) | TRANSDERMAL | Status: DC

## 2011-08-09 NOTE — ED Provider Notes (Signed)
History     CSN: [id number]  Arrival date & time 08/09/11  1135   First MD Initiated Contact with Patient 08/09/11 1158     CC: rash and weakness  HPI Pt was diagnosed with lupus about 2 weeks ago.  She has had a rash for the last 10 weeks.  Pt has been on multiple treatments.  The rash has not gotten any better.  Pt started to feel weaker today.  She was scheduled to see her doctor today and was told to come to the ED.  She was seen by Dr Karen Chafe.   Pt denies fever or cough.  No chest pain or shortness of breath but she does feel like her breathing is more difficult when she tries to walk around.  She had been on steroids but tapered off of that and was switched to hydroxychloroquine.  No trouble with her speech or coordination.    Past Medical History  Diagnosis Date  . Unspecified asthma   . Allergic rhinitis, cause unspecified   . Right ear pain   . Lupus (systemic lupus erythematosus)     History reviewed. No pertinent past surgical history.  Family History  Problem Relation Age of Onset  . Heart disease Mother   . Heart disease Brother     History  Substance Use Topics  . Smoking status: Former Smoker    Types: Cigarettes    Quit date: 04/22/1998  . Smokeless tobacco: Not on file  . Alcohol Use: Yes     Occasionally    OB History    Grav Para Term Preterm Abortions TAB SAB Ect Mult Living                  Review of Systems  Constitutional: Negative for fever.  HENT: Negative for facial swelling and neck pain.   Eyes: Negative for pain.  Respiratory: Positive for shortness of breath.   Cardiovascular: Negative for leg swelling.  Genitourinary: Negative for hematuria.  Musculoskeletal: Negative for back pain.  Neurological: Negative for seizures.  All other systems reviewed and are negative.    Allergies  Review of patient's allergies indicates no known allergies.  Home Medications   Current Outpatient Rx  Name Route Sig Dispense Refill  .  ALBUTEROL SULFATE HFA 108 (90 BASE) MCG/ACT IN AERS Inhalation Inhale 2 puffs into the lungs 4 (four) times daily as needed for shortness of breath. 1 Inhaler prn  . VITAMIN D 1000 UNITS PO CAPS Oral Take 1,000 Units by mouth daily.      Marland Kitchen ESOMEPRAZOLE MAGNESIUM 40 MG PO CPDR Oral Take 40 mg by mouth daily before breakfast.      . FLAX SEED OIL 1000 MG PO CAPS Oral Take 1 capsule by mouth daily.      Marland Kitchen FLUTICASONE-SALMETEROL 100-50 MCG/DOSE IN AEPB Inhalation Inhale 1 puff into the lungs every 12 (twelve) hours. Rinse mouth 60 each prn  . ALIGN 4 MG PO CAPS Oral Take 1 capsule by mouth daily.      . SERTRALINE HCL 50 MG PO TABS Oral Take 50 mg by mouth daily.        BP 112/88  Pulse 130  Temp 98.7 F (37.1 C)  Resp 16  SpO2 97%  Physical Exam  Nursing note and vitals reviewed. Constitutional: No distress.  HENT:  Head: Normocephalic and atraumatic.  Right Ear: External ear normal.  Left Ear: External ear normal.  Eyes: Conjunctivae are normal. Right eye exhibits no discharge.  Left eye exhibits no discharge. No scleral icterus.  Neck: Neck supple. No tracheal deviation present.  Cardiovascular: Normal rate, regular rhythm and intact distal pulses.  Exam reveals no gallop.   No murmur heard. Pulmonary/Chest: Effort normal and breath sounds normal. No stridor. No respiratory distress. She has no wheezes. She has no rales.  Abdominal: Soft. Bowel sounds are normal. She exhibits no distension. There is no tenderness. There is no rebound and no guarding.  Musculoskeletal: She exhibits no edema and no tenderness.  Neurological: She is alert. She has normal strength. No sensory deficit. Cranial nerve deficit:  no gross defecits noted. She exhibits normal muscle tone. She displays no seizure activity. Coordination normal.  Skin: Skin is warm and dry. Petechiae and rash noted. No abrasion noted. Rash is macular and papular. Rash is not nodular, not pustular and not vesicular. There is erythema.  No pallor.       Diffuse rash, certain areas that do not blanch with palpation  Psychiatric: She has a normal mood and affect.    ED Course  Procedures (including critical care time)  Rate: 126  Rhythm: sinus tach  QRS Axis: normal  Intervals: normal  ST/T Wave abnormalities: st depression all leads  Conduction Disutrbances:none  Narrative Interpretation: st changes new  Old EKG Reviewed: none available  Labs Reviewed  CBC - Abnormal; Notable for the following:    WBC 3.8 (*)    Hemoglobin 15.3 (*)    All other components within normal limits  COMPREHENSIVE METABOLIC PANEL - Abnormal; Notable for the following:    Sodium 134 (*)    Glucose, Bld 115 (*)    GFR calc non Af Amer 69 (*)    GFR calc Af Amer 80 (*)    All other components within normal limits  SEDIMENTATION RATE - Abnormal; Notable for the following:    Sed Rate 27 (*)    All other components within normal limits  PROTIME-INR  APTT  CK TOTAL AND CKMB  TROPONIN I   Dg Chest 2 View  08/09/2011  *RADIOLOGY REPORT*  Clinical Data: 66 year old female with lupus.  Weakness, tachycardia, rash.  CHEST - 2 VIEW  Comparison: 10/10/2008 and earlier.  Findings: Normal lung volumes. Normal cardiac size and mediastinal contours.  Visualized tracheal air column is within normal limits. The lungs are clear.  Incidental EKG button artifact in both lungs. No pneumothorax or effusion. No acute osseous abnormality identified.  IMPRESSION: Negative, no acute cardiopulmonary abnormality.  Original Report Authenticated By: Harley Hallmark, M.D.      MDM  Rash suggestive of a vasculitis in certain areas. Patient has confirmed this by skin biopsy. She presents however with worsening symptoms despite being on oral steroid medications and then recently switched to hydroxychloroquine.  Patient presented with significant tachycardia whenever she exerts herself. EKG and cardiac markers are not showing evidence to suggest a myocarditis. With her  tachycardia and dyspnea I will order a CT scan to rule out pulmonary embolism. I will consult the medical service to see about admission for IV steroids and possible rheumatology consult to        Celene Kras, MD 08/09/11 1416

## 2011-08-09 NOTE — H&P (Signed)
Patient's PCP: Virgilio Belling, PA-C  Patient's Dermatologist: Dr. Sharyn Lull  Chief Complaint: Generalized rash  History of Present Illness: [name] [name] is a 66 y.o. Caucasian female with history of tobacco use, allergic rhinitis, with new diagnosis of cutaneous lupus who presents with the above complaints.  Patient reports that she had a urologic procedure done approximately 10 weeks ago and was given trimethoprim as antibiotic.  Subsequently after that she noted a generalized rash.  The rash has gotten worse and she has gone to her dermatologist Dr. Sharyn Lull and has had multiple skin biopsies done and was eventually referred [facility].  She had biopsies and labs done which indicated that the patient has cutaneous lupus, labs and biopsy not available for review.  These findings were discussed with on call dermatology at [facility].  Patient has been on multiple trials of prednisone without any improvement in her symptom.  She reported that her rash has continued to worsen as a result went to her primary care physician's office who in turn referred the patient to the emergency department for further evaluation.  She reported that in the last 24 hours her rash has gotten worse and now has spread to the face.  She denies any recent fevers, chills, vomiting, chest pain, shortness of breath, abdominal pain, diarrhea, headaches or vision changes.  She does report having nausea today which has resolved.  Patient's sister noted that she is slightly out of breath but denied any shortness of breath on my interview with the patient.  Past Medical History  Diagnosis Date  . Unspecified asthma   . Allergic rhinitis, cause unspecified   . Right ear pain   . Lupus (systemic lupus erythematosus)    Past Surgical History  Procedure Date  . No past surgeries    Family History  Problem Relation Age of Onset  . Heart disease Mother   . Heart disease Brother     History   Social History  . Marital Status: Divorced    Spouse Name: N/A    Number of Children: 0  . Years of Education: N/A   Occupational History  . Retired    Social History Main Topics  . Smoking status: Former Smoker    Types: Cigarettes    Quit date: 06/21/2010  . Smokeless tobacco: Never Used   Comment: Quit 4 weeks ago.  . Alcohol Use: Yes     Occasionally  . Drug Use: No  . Sexually Active: Not on file   Other Topics Concern  . Not on file   Social History Narrative  . No narrative on file   Allergies: Review of patient's allergies indicates no known allergies.  Meds: Scheduled Meds:   . diphenhydrAMINE  25 mg Intravenous Q6H  . methylPREDNISolone (SOLU-MEDROL) injection  60 mg Intravenous Q6H  . DISCONTD: nitroGLYCERIN  1 inch Topical Q6H   Continuous Infusions:   . sodium chloride    . DISCONTD: sodium chloride 1,000 mL (08/09/11 1229)   PRN Meds:.iohexol  Review of Systems: All systems reviewed with the patient and positive as per history of present illness, otherwise all other systems are negative.  Physical Exam: Blood pressure 139/55, pulse 94, temperature 98.8 F (37.1 C), temperature source Oral, resp. rate 17, SpO2 100.00%. General: Awake, Oriented x3, No acute distress. HEENT: EOMI, Moist mucous membranes, posterior oropharynx clear. Neck: Supple CV: S1 and S2 Lungs: Clear to ascultation bilaterally Abdomen: Soft, Nontender, Nondistended, +bowel sounds. Ext: Good pulses.  Trace edema. No clubbing or cyanosis noted. Neuro: Cranial Nerves II-XII grossly intact. Has 5/5 motor strength in upper and lower extremities. Skin: Generalized maculopapular blanching erythematous rash on chest, back, arms and legs bilaterally.  Rash is worse on her back and arms.  Also had a rash on her face does not cross the nasolabial fold.  Lab results:  [facility] 08/09/11 1210  NA 134*  K 3.6  CL 100  CO2 19  GLUCOSE 115*  BUN 16  CREATININE 0.86   CALCIUM 8.8  MG --  PHOS --    Basename 08/09/11 1210  AST 32  ALT 28  ALKPHOS 65  BILITOT 0.3  PROT 7.3  ALBUMIN 3.6   No results found for this basename: LIPASE:2,AMYLASE:2 in the last 72 hours  Basename 08/09/11 1210  WBC 3.8*  NEUTROABS --  HGB 15.3*  HCT 44.3  MCV 90.6  PLT 289    Basename 08/09/11 1219 08/09/11 1210  CKTOTAL -- 46  CKMB -- 1.3  CKMBINDEX -- --  TROPONINI <0.30 --   No components found with this basename: POCBNP:3 No results found for this basename: DDIMER in the last 72 hours No results found for this basename: HGBA1C:2 in the last 72 hours No results found for this basename: CHOL:2,HDL:2,LDLCALC:2,TRIG:2,CHOLHDL:2,LDLDIRECT:2 in the last 72 hours No results found for this basename: TSH,T4TOTAL,FREET3,T3FREE,THYROIDAB in the last 72 hours No results found for this basename: VITAMINB12:2,FOLATE:2,FERRITIN:2,TIBC:2,IRON:2,RETICCTPCT:2 in the last 72 hours Imaging results:  Dg Chest 2 View  08/09/2011  *RADIOLOGY REPORT*  Clinical Data: 66 year old female with lupus.  Weakness, tachycardia, rash.  CHEST - 2 VIEW  Comparison: 10/10/2008 and earlier.  Findings: Normal lung volumes. Normal cardiac size and mediastinal contours.  Visualized tracheal air column is within normal limits. The lungs are clear.  Incidental EKG button artifact in both lungs. No pneumothorax or effusion. No acute osseous abnormality identified.  IMPRESSION: Negative, no acute cardiopulmonary abnormality.  Original Report Authenticated By: Harley Hallmark, M.D.   Ct Angio Chest W/cm &/or Wo Cm  08/09/2011  *RADIOLOGY REPORT*  Clinical Data: Tachycardia, dyspnea, rash, rule out pulmonary embolus  CT ANGIOGRAPHY CHEST  Technique:  Multidetector CT imaging of the chest using the standard protocol during bolus administration of intravenous contrast. Multiplanar reconstructed images including MIPs were obtained and reviewed to evaluate the vascular anatomy.  Contrast: OMNIPAQUE  IOHEXOL 300 MG/ML  SOLN  Comparison: None.  Findings: The study is of excellent technical quality.  No pulmonary embolus is noted.  There is no mediastinal hematoma or adenopathy.  Bilateral axillary lymph nodes are noted.  A left axillary lymph node measures 2.1 x 1.3 cm.  Right axillary lymph node measures 1.3 x 1 cm.  This are borderline by size criteria.  There is a 3 x 2 cm low density lesion in the anterior mediastinum anterior to the pulmonary artery and aorta.  This lesion is in the upper mediastinum.  This is best visualized in the sagittal image 76 measures 3.1 cm cranial caudally by 2 cm.  A punctate calcification is noted within the lesion.  This lesion has benign features may represent a teratoma, pericardial cyst, thymic cyst or thymic rest.  Less likely a lymph node.  Clinical correlation is necessary. Follow-up examination should be based on clinical grounds.  Central pulmonary artery is unremarkable.  Mild atherosclerotic calcifications of the coronary arteries.  No destructive bony lesions are noted.  Images of the lung parenchyma shows no acute infiltrate or pleural effusion.  No  pulmonary edema.  Visualized upper abdomen shows no intrahepatic biliary ductal dilatation.  There is a cyst or hemangioma in the right hepatic lobe measures 8.5 mm.  The visualized tail of the pancreas is unremarkable.  No adrenal gland mass is noted.  IMPRESSION:  1.  No pulmonary embolus. 2.  No acute infiltrate or pulmonary edema. 3.  There is a low density lobulated lesion in the upper anterior mediastinum measures 3 x 2 cm.  A punctate calcification is noted within the lesion.  No aggressive features are noted.  May represent a thymic or pericardial cyst, teratoma or thymic rest. Less likely a pathologic lymph node. Follow-up examination should be based on clinical grounds. 4.  Borderline bilateral axillary lymph nodes.  Original Report Authenticated By: Natasha Mead, M.D.   Other results: EKG: sinus  tachycardia.  Assessment & Plan by Problem: Generalized maculopapular rash Likely due to new diagnosis of cutaneous lupus, as per biopsy done at [facility], records unavailable for my review. Patient had + ANA (1:320 speckled pattern), which was passed on to me from discussion by on-call dermatology at [facility].  Discussed the patient's management with Dr. Sharyn Lull with Dermatology, [facility] on call dermatologist and Dr. Dierdre Forth, rheumatology.  Given patient's complicated long course without any improvement in symptoms while being on steroids , however the patient is currently off steroids. Dr. Dierdre Forth, recommended the patient have a trial on high dose steroids IV for 24 to 48 hours to see how the patient would respond and then can be transitioned to prednisone at 60 mg daily.  Continue hydroxychloroquine.  Dr. Dierdre Forth, indicated that the patient will need further rheumatology workup for systemic lupus and may benefit from being started on mycophenolate, which can be done as outpatient.  Hydroxychloroquine and mycophenolate will take weeks to months before they would start having an effect on the patient.  The hope is that the patient will improve on high-dose steroids with outpatient followup with Dr. Dierdre Forth and [facility] dermatology.  Will have the patient on scheduled Benadryl with when necessary Benadryl and hydroxyzine.  Urine analysis negative.  Tobacco abuse Counseled the patient on smoking cessation.  Low density lobulated lesion in the upper anterior mediastinum measures 3 x 2 cm on CT of the chest Findings were discussed with patient and sister.  Uncertain if this has any correlation with patient's generalized rash and new diagnosis of cutaneous lupus.  Likely will need outpatient followup under care of primary care physician.  Leukopenia Continue to monitor.  Mild hyponatremia Continue to monitor.  Tachycardia Improved with gentle hydration.  Suspect patient has  dehydration from insensible water loss from skin.  Mild dehydration Continue gentle hydration.  Polycythemia Likely due to mild dehydration continued monitor.  Asthma Stable.  Not an active issue at this time.  Prophylaxis SCDs.  CODE STATUS Full code.  Time spent on admission, talking to the patient, patient's dermatologists, consultants and coordinating care was: 120 mins.  Asako Saliba A, MD 08/09/2011, 4:27 PM

## 2011-08-09 NOTE — ED Notes (Addendum)
Rash started 10 weeks ago covers trunk, anterior and posterior, both legs, arms, reason for coming to ED today is rash went to face and neck. Recently diagnosed with Lupus, has had rash biopsied ,

## 2011-08-09 NOTE — Progress Notes (Deleted)
CRITICAL VALUE ALERT  Critical value received:Positive Blood Cultures  Date of notification:  08/09/2011  Time of notification:  1855  Critical value read back:yes  Nurse who received alert:  Orlene Och RN  MD notified (1st page):  Ashley Royalty  Time of first page:  1856  MD notified (2nd page):  Time of second page:  Responding MD:  Ashley Royalty  Time MD responded:  1900

## 2011-08-09 NOTE — ED Notes (Addendum)
Sent over from [facility] with weakness and tachycardia. Recently dx with Lupus 2 weeks ago. Rash to entire body and face that has progressively worsened over the last 10 weeks. HR 130.  Denies chest pain, denies SOB

## 2011-08-10 LAB — CBC
HCT: 37.5 % (ref 36.0–46.0)
Hemoglobin: 12.7 g/dL (ref 12.0–15.0)
RBC: 4.15 MIL/uL (ref 3.87–5.11)
WBC: 2 10*3/uL — ABNORMAL LOW (ref 4.0–10.5)

## 2011-08-10 LAB — BASIC METABOLIC PANEL
Chloride: 107 mEq/L (ref 96–112)
GFR calc Af Amer: >90 mL/min (ref >90)
GFR calc non Af Amer: 89 mL/min — ABNORMAL LOW (ref >90)
Potassium: 3.7 mEq/L (ref 3.5–5.1)
Sodium: 138 mEq/L (ref 135–145)

## 2011-08-10 MED ORDER — LORAZEPAM 0.5 MG PO TABS
0.5000 mg | ORAL_TABLET | Freq: Once | ORAL | Status: AC
  Administered 2011-08-10: 0.5 mg via ORAL
  Filled 2011-08-10: qty 1

## 2011-08-10 NOTE — Progress Notes (Signed)
Subjective: Patient continues to have generalized rash which is about similar to yesterday.  No other specific complaints.  Objective: Vital signs in last 24 hours: Filed Vitals:   08/09/11 1700 08/09/11 1929 08/09/11 2015 08/10/11 0501  BP: 150/62 108/64 134/80 121/75  Pulse: 98 95 90 82  Temp:  98.6 F (37 C) 99.1 F (37.3 C) 98.6 F (37 C)  TempSrc:  Oral Oral Oral  Resp: 15 19 16 20   Height:   5\' 2"  (1.575 m)   Weight:   73.8 kg (162 lb 11.2 oz)   SpO2: 100% 99% 95% 96%   Weight change:   Intake/Output Summary (Last 24 hours) at 08/10/11 0903 Last data filed at 08/10/11 0600  Gross per 24 hour  Intake    965 ml  Output    500 ml  Net    465 ml    Physical Exam: General: Awake, Oriented, No acute distress. HEENT: EOMI. Neck: Supple CV: S1 and S2 Lungs: Clear to ascultation bilaterally Abdomen: Soft, Nontender, Nondistended, +bowel sounds. Ext: Good pulses. Trace edema. Skin: Generalized maculopapular blanching erythematous rash on chest, back, arms and legs bilaterally. Rash is worse on her back and arms.   Lab Results:  Basename 08/10/11 0410 08/09/11 1210  NA 138 134*  K 3.7 3.6  CL 107 100  CO2 21 19  GLUCOSE 141* 115*  BUN 13 16  CREATININE 0.71 0.86  CALCIUM 8.5 8.8  MG -- --  PHOS -- --    Basename 08/09/11 1210  AST 32  ALT 28  ALKPHOS 65  BILITOT 0.3  PROT 7.3  ALBUMIN 3.6   No results found for this basename: LIPASE:2,AMYLASE:2 in the last 72 hours  Basename 08/10/11 0410 08/09/11 1210  WBC 2.0* 3.8*  NEUTROABS -- --  HGB 12.7 15.3*  HCT 37.5 44.3  MCV 90.4 90.6  PLT 258 289    Basename 08/09/11 1219 08/09/11 1210  CKTOTAL -- 46  CKMB -- 1.3  CKMBINDEX -- --  TROPONINI <0.30 --   No components found with this basename: POCBNP:3 No results found for this basename: DDIMER:2 in the last 72 hours No results found for this basename: HGBA1C:2 in the last 72 hours No results found for this basename:  CHOL:2,HDL:2,LDLCALC:2,TRIG:2,CHOLHDL:2,LDLDIRECT:2 in the last 72 hours No results found for this basename: TSH,T4TOTAL,FREET3,T3FREE,THYROIDAB in the last 72 hours No results found for this basename: VITAMINB12:2,FOLATE:2,FERRITIN:2,TIBC:2,IRON:2,RETICCTPCT:2 in the last 72 hours  Micro Results: No results found for this or any previous visit (from the past 240 hour(s)).  Studies/Results: Dg Chest 2 View  08/09/2011  *RADIOLOGY REPORT*  Clinical Data: 66 year old female with lupus.  Weakness, tachycardia, rash.  CHEST - 2 VIEW  Comparison: 10/10/2008 and earlier.  Findings: Normal lung volumes. Normal cardiac size and mediastinal contours.  Visualized tracheal air column is within normal limits. The lungs are clear.  Incidental EKG button artifact in both lungs. No pneumothorax or effusion. No acute osseous abnormality identified.  IMPRESSION: Negative, no acute cardiopulmonary abnormality.  Original Report Authenticated By: Harley Hallmark, M.D.   Ct Angio Chest W/cm &/or Wo Cm  08/09/2011  *RADIOLOGY REPORT*  Clinical Data: Tachycardia, dyspnea, rash, rule out pulmonary embolus  CT ANGIOGRAPHY CHEST  Technique:  Multidetector CT imaging of the chest using the standard protocol during bolus administration of intravenous contrast. Multiplanar reconstructed images including MIPs were obtained and reviewed to evaluate the vascular anatomy.  Contrast: OMNIPAQUE IOHEXOL 300 MG/ML  SOLN  Comparison: None.  Findings: The study  is of excellent technical quality.  No pulmonary embolus is noted.  There is no mediastinal hematoma or adenopathy.  Bilateral axillary lymph nodes are noted.  A left axillary lymph node measures 2.1 x 1.3 cm.  Right axillary lymph node measures 1.3 x 1 cm.  This are borderline by size criteria.  There is a 3 x 2 cm low density lesion in the anterior mediastinum anterior to the pulmonary artery and aorta.  This lesion is in the upper mediastinum.  This is best visualized in the  sagittal image 76 measures 3.1 cm cranial caudally by 2 cm.  A punctate calcification is noted within the lesion.  This lesion has benign features may represent a teratoma, pericardial cyst, thymic cyst or thymic rest.  Less likely a lymph node.  Clinical correlation is necessary. Follow-up examination should be based on clinical grounds.  Central pulmonary artery is unremarkable.  Mild atherosclerotic calcifications of the coronary arteries.  No destructive bony lesions are noted.  Images of the lung parenchyma shows no acute infiltrate or pleural effusion.  No pulmonary edema.  Visualized upper abdomen shows no intrahepatic biliary ductal dilatation.  There is a cyst or hemangioma in the right hepatic lobe measures 8.5 mm.  The visualized tail of the pancreas is unremarkable.  No adrenal gland mass is noted.  IMPRESSION:  1.  No pulmonary embolus. 2.  No acute infiltrate or pulmonary edema. 3.  There is a low density lobulated lesion in the upper anterior mediastinum measures 3 x 2 cm.  A punctate calcification is noted within the lesion.  No aggressive features are noted.  May represent a thymic or pericardial cyst, teratoma or thymic rest. Less likely a pathologic lymph node. Follow-up examination should be based on clinical grounds. 4.  Borderline bilateral axillary lymph nodes.  Original Report Authenticated By: Natasha Mead, M.D.    Medications: I have reviewed the patient's current medications. Scheduled Meds:   . diphenhydrAMINE  25 mg Intravenous Q6H  . hydroxychloroquine  200 mg Oral BID  . LORazepam  0.5 mg Oral Once  . methylPREDNISolone (SOLU-MEDROL) injection  60 mg Intravenous Q6H  . sertraline  100 mg Oral Daily  . DISCONTD: nitroGLYCERIN  1 inch Topical Q6H   Continuous Infusions:   . sodium chloride    . DISCONTD: sodium chloride 1,000 mL (08/09/11 1229)   PRN Meds:.acetaminophen, acetaminophen, diphenhydrAMINE, hydrOXYzine, iohexol, triamcinolone  cream  Assessment/Plan: Generalized maculopapular rash  Likely due to new diagnosis of cutaneous lupus, as per biopsy done at [facility], records unavailable for my review. Patient had + ANA (1:320 speckled pattern), which was passed on to me from discussion by on-call dermatology at [facility]. Discussed the patient's management on admission with Dr. Sharyn Lull with Dermatology, [facility] on call dermatologist and Dr. Dierdre Forth, rheumatology. Given patient's complicated long course without any improvement in symptoms while being on steroids , however the patient is currently off steroids, Dr. Dierdre Forth, recommended the patient have a trial on high dose steroids IV for 24 to 48 hours.  The patient has had only minimal improvement with IV steroids over the last 12 hours, continue another 24 hour course before transitioning the patient to prednisone at 60 mg daily. Continue hydroxychloroquine. Dr. Dierdre Forth, indicated that the patient will need further rheumatology workup for systemic lupus and may benefit from being started on mycophenolate, which can be done as outpatient. Hydroxychloroquine and mycophenolate will take weeks to months before they would start having an effect on the  patient.  Patient to have outpatient followup with Dr. Dierdre Forth and [facility] dermatology. Will have the patient on scheduled Benadryl with when necessary Benadryl and hydroxyzine. Urine analysis negative.   Tobacco abuse  Counseled the patient on smoking cessation.   Low density lobulated lesion in the upper anterior mediastinum measures 3 x 2 cm on CT of the chest  Findings were discussed with patient and sister. Uncertain if this has any correlation with patient's generalized rash and new diagnosis of cutaneous lupus. Likely will need outpatient followup under care of primary care physician.   Leukopenia  Continue to monitor.   Mild hyponatremia  Resolved.   Tachycardia  Resolved with gentle hydration.  Suspect patient has dehydration from insensible water loss from skin.   Mild dehydration  Resolved.    Polycythemia  Resolved with hydration.  Likely due to mild dehydration.  Asthma  Stable. Not an active issue at this time.   Prophylaxis  SCDs.   CODE STATUS  Full code.  Disposition Pending, consider discharge in 24 hours if rash improving.   LOS: 1 day  Regnald Bowens A, MD 08/10/2011, 9:03 AM

## 2011-08-11 LAB — URINE CULTURE: Colony Count: 1000

## 2011-08-11 MED ORDER — PREDNISONE 50 MG PO TABS: 60.0000 mg | ORAL_TABLET | Freq: Every day | ORAL | Status: DC

## 2011-08-11 MED ORDER — PREDNISONE 20 MG PO TABS: 60.0000 mg | ORAL_TABLET | Freq: Every day | ORAL | Status: AC

## 2011-08-11 MED ORDER — PREDNISONE 50 MG PO TABS
60.0000 mg | ORAL_TABLET | ORAL | Status: AC
  Administered 2011-08-11: 60 mg via ORAL
  Filled 2011-08-11: qty 1

## 2011-08-11 MED ORDER — DIPHENHYDRAMINE HCL 25 MG PO TABS: 25.0000 mg | ORAL_TABLET | Freq: Four times a day (QID) | ORAL | Status: DC

## 2011-08-11 NOTE — Discharge Summary (Signed)
Discharge Summary  [name] [name] MR#: [id number]  DOB:1946-03-18  Date of Admission: 08/09/2011 Date of Discharge: 08/11/2011  Patient's PCP: Virgilio Belling, PA-C Patient's Dermatologist: [facility] Patient's Rheumatologist: Dr. Dierdre Forth  Attending Physician:Danis Pembleton A  Consults: Telephone consult with Dr. Sharyn Lull, dermatology; Dr. Dierdre Forth, rheumatology; [facility] dermatology  Discharge Diagnoses: Principal Problem:  *Generalized maculopapular rash Active Problems:  ASTHMA  Tobacco abuse  Abnormal CT of the chest  Leukopenia  Hyponatremia  Tachycardia  Brief Admitting History and Physical [name] [name] is a 66 y.o. Caucasian female with history of tobacco use, allergic rhinitis, with new diagnosis of cutaneous lupus who presented on 08/09/2011 with generalized rash.   Discharge Medications Medication List  As of 08/11/2011 11:24 AM   TAKE these medications         albuterol 108 (90 BASE) MCG/ACT inhaler   Commonly known as: PROVENTIL HFA;VENTOLIN HFA   Inhale 2 puffs into the lungs 4 (four) times daily as needed for shortness of breath.      diphenhydrAMINE 25 MG tablet   Commonly known as: BENADRYL   Take 50 mg by mouth every 6 (six) hours as needed. For skin irritation      diphenhydrAMINE 25 MG tablet   Commonly known as: BENADRYL   Take 1 tablet (25 mg total) by mouth every 6 (six) hours.      hydroxychloroquine 200 MG tablet   Commonly known as: PLAQUENIL   Take 200 mg by mouth 2 (two) times daily.      hydrOXYzine 25 MG tablet   Commonly known as: ATARAX/VISTARIL   Take 25 mg by mouth at bedtime.      predniSONE 20 MG tablet   Commonly known as: DELTASONE   Take 3 tablets (60 mg total) by mouth daily with breakfast.      sertraline 100 MG tablet   Commonly known as: ZOLOFT   Take 100 mg by mouth daily.      triamcinolone cream 0.1 %   Commonly known as: KENALOG   Apply 1 application topically daily as needed. For skin relief              Hospital Course: Generalized maculopapular rash  Likely due to new diagnosis of cutaneous lupus, as per biopsy done at [facility], records unavailable for my review. Patient had + ANA (1:320 speckled pattern), which was passed on to me from discussion by on-call dermatology at [facility]. Discussed the patient's management at admission with Dr. Sharyn Lull with Dermatology, [facility] on call dermatologist and Dr. Dierdre Forth, rheumatology. Given patient's complicated long course without any improvement in symptoms while being on steroids, however the patient is currently off steroids since being started on hydroxychloroquine, Dr. Dierdre Forth, recommended the patient have a trial on high dose steroids IV for 24 to 48 hours. Patient has had a trial of steroids for 48 hours with some improvement in her rash. Transitioned the patient to prednisone at 60 mg daily, prior to discharge, defer to Dr. Dierdre Forth to taper the steroids. Continue hydroxychloroquine. Dr. Dierdre Forth, indicated that the patient will need further rheumatology workup for systemic lupus and may benefit from being started on mycophenolate, which can be done as outpatient. Hydroxychloroquine and mycophenolate will take weeks to months before they would start having an effect on the patient. Patient to have outpatient followup with Dr. Dierdre Forth and [facility] dermatology. Continue the patient on scheduled Benadryl with when necessary Benadryl and hydroxyzine. Urine analysis negative.   Tobacco abuse  Counseled the patient on smoking cessation.   Low density lobulated lesion in the upper anterior mediastinum measures 3 x 2 cm on CT of the chest  Findings were discussed with patient and sister. Uncertain if this has any correlation with patient's generalized rash and new diagnosis of cutaneous lupus. Likely will need outpatient followup under care of primary care physician.   Leukopenia  Stable, uncertain if related to Lupus or  hydroxychloroquine.   Mild hyponatremia  Resolved.   Tachycardia  Resolved with gentle hydration. Suspect patient has dehydration from insensible water loss from skin.   Mild dehydration  Resolved.   Polycythemia  Resolved with hydration. Likely due to mild dehydration.   Asthma  Stable. Not an active issue at this time.   Day of Discharge BP 111/70  Pulse 76  Temp(Src) 98.3 F (36.8 C) (Oral)  Resp 16  Ht 5\' 2"  (1.575 m)  Wt 73.8 kg (162 lb 11.2 oz)  BMI 29.76 kg/m2  SpO2 96%  Results for orders placed during the hospital encounter of 08/09/11 (from the past 48 hour(s))  CBC     Status: Abnormal   Collection Time   08/09/11 12:10 PM      Component Value Range Comment   WBC 3.8 (*) 4.0 - 10.5 (K/uL)    RBC 4.89  3.87 - 5.11 (MIL/uL)    Hemoglobin 15.3 (*) 12.0 - 15.0 (g/dL)    HCT 16.1  09.6 - 04.5 (%)    MCV 90.6  78.0 - 100.0 (fL)    MCH 31.3  26.0 - 34.0 (pg)    MCHC 34.5  30.0 - 36.0 (g/dL)    RDW 40.9  81.1 - 91.4 (%)    Platelets 289  150 - 400 (K/uL)   COMPREHENSIVE METABOLIC PANEL     Status: Abnormal   Collection Time   08/09/11 12:10 PM      Component Value Range Comment   Sodium 134 (*) 135 - 145 (mEq/L)    Potassium 3.6  3.5 - 5.1 (mEq/L)    Chloride 100  96 - 112 (mEq/L)    CO2 19  19 - 32 (mEq/L)    Glucose, Bld 115 (*) 70 - 99 (mg/dL)    BUN 16  6 - 23 (mg/dL)    Creatinine, Ser 7.82  0.50 - 1.10 (mg/dL)    Calcium 8.8  8.4 - 10.5 (mg/dL)    Total Protein 7.3  6.0 - 8.3 (g/dL)    Albumin 3.6  3.5 - 5.2 (g/dL)    AST 32  0 - 37 (U/L)    ALT 28  0 - 35 (U/L)    Alkaline Phosphatase 65  39 - 117 (U/L)    Total Bilirubin 0.3  0.3 - 1.2 (mg/dL)    GFR calc non Af Amer 69 (*) >90 (mL/min)    GFR calc Af Amer 80 (*) >90 (mL/min)   PROTIME-INR     Status: Normal   Collection Time   08/09/11 12:10 PM      Component Value Range Comment   Prothrombin Time 13.4  11.6 - 15.2 (seconds)    INR 1.00  0.00 - 1.49    APTT     Status: Normal   Collection  Time   08/09/11 12:10 PM      Component Value Range Comment   aPTT 30  24 - 37 (seconds)   CK TOTAL AND CKMB     Status: Normal   Collection Time   08/09/11  12:10 PM      Component Value Range Comment   Total CK 46  7 - 177 (U/L)    CK, MB 1.3  0.3 - 4.0 (ng/mL)    Relative Index RELATIVE INDEX IS INVALID  0.0 - 2.5    SEDIMENTATION RATE     Status: Abnormal   Collection Time   08/09/11 12:10 PM      Component Value Range Comment   Sed Rate 27 (*) 0 - 22 (mm/hr)   TROPONIN I     Status: Normal   Collection Time   08/09/11 12:19 PM      Component Value Range Comment   Troponin I <0.30  <0.30 (ng/mL)   URINALYSIS, ROUTINE W REFLEX MICROSCOPIC     Status: Abnormal   Collection Time   08/09/11  4:50 PM      Component Value Range Comment   Color, Urine YELLOW  YELLOW     APPearance CLEAR  CLEAR     Specific Gravity, Urine >1.046 (*) 1.005 - 1.030     pH 6.0  5.0 - 8.0     Glucose, UA NEGATIVE  NEGATIVE (mg/dL)    Hgb urine dipstick NEGATIVE  NEGATIVE     Bilirubin Urine NEGATIVE  NEGATIVE     Ketones, ur 15 (*) NEGATIVE (mg/dL)    Protein, ur NEGATIVE  NEGATIVE (mg/dL)    Urobilinogen, UA 1.0  0.0 - 1.0 (mg/dL)    Nitrite NEGATIVE  NEGATIVE     Leukocytes, UA NEGATIVE  NEGATIVE  MICROSCOPIC NOT DONE ON URINES WITH NEGATIVE PROTEIN, BLOOD, LEUKOCYTES, NITRITE, OR GLUCOSE <1000 mg/dL.  BASIC METABOLIC PANEL     Status: Abnormal   Collection Time   08/10/11  4:10 AM      Component Value Range Comment   Sodium 138  135 - 145 (mEq/L)    Potassium 3.7  3.5 - 5.1 (mEq/L)    Chloride 107  96 - 112 (mEq/L)    CO2 21  19 - 32 (mEq/L)    Glucose, Bld 141 (*) 70 - 99 (mg/dL)    BUN 13  6 - 23 (mg/dL)    Creatinine, Ser 1.61  0.50 - 1.10 (mg/dL)    Calcium 8.5  8.4 - 10.5 (mg/dL)    GFR calc non Af Amer 89 (*) >90 (mL/min)    GFR calc Af Amer >90  >90 (mL/min)   CBC     Status: Abnormal   Collection Time   08/10/11  4:10 AM      Component Value Range Comment   WBC 2.0 (*) 4.0 - 10.5  (K/uL)    RBC 4.15  3.87 - 5.11 (MIL/uL)    Hemoglobin 12.7  12.0 - 15.0 (g/dL)    HCT 09.6  04.5 - 40.9 (%)    MCV 90.4  78.0 - 100.0 (fL)    MCH 30.6  26.0 - 34.0 (pg)    MCHC 33.9  30.0 - 36.0 (g/dL)    RDW 81.1  91.4 - 78.2 (%)    Platelets 258  150 - 400 (K/uL)     Dg Chest 2 View  08/09/2011  *RADIOLOGY REPORT*  Clinical Data: 66 year old female with lupus.  Weakness, tachycardia, rash.  CHEST - 2 VIEW  Comparison: 10/10/2008 and earlier.  Findings: Normal lung volumes. Normal cardiac size and mediastinal contours.  Visualized tracheal air column is within normal limits. The lungs are clear.  Incidental EKG button artifact in both lungs. No pneumothorax or effusion.  No acute osseous abnormality identified.  IMPRESSION: Negative, no acute cardiopulmonary abnormality.  Original Report Authenticated By: Harley Hallmark, M.D.   Ct Angio Chest W/cm &/or Wo Cm  08/09/2011  *RADIOLOGY REPORT*  Clinical Data: Tachycardia, dyspnea, rash, rule out pulmonary embolus  CT ANGIOGRAPHY CHEST  Technique:  Multidetector CT imaging of the chest using the standard protocol during bolus administration of intravenous contrast. Multiplanar reconstructed images including MIPs were obtained and reviewed to evaluate the vascular anatomy.  Contrast: OMNIPAQUE IOHEXOL 300 MG/ML  SOLN  Comparison: None.  Findings: The study is of excellent technical quality.  No pulmonary embolus is noted.  There is no mediastinal hematoma or adenopathy.  Bilateral axillary lymph nodes are noted.  A left axillary lymph node measures 2.1 x 1.3 cm.  Right axillary lymph node measures 1.3 x 1 cm.  This are borderline by size criteria.  There is a 3 x 2 cm low density lesion in the anterior mediastinum anterior to the pulmonary artery and aorta.  This lesion is in the upper mediastinum.  This is best visualized in the sagittal image 76 measures 3.1 cm cranial caudally by 2 cm.  A punctate calcification is noted within the lesion.  This  lesion has benign features may represent a teratoma, pericardial cyst, thymic cyst or thymic rest.  Less likely a lymph node.  Clinical correlation is necessary. Follow-up examination should be based on clinical grounds.  Central pulmonary artery is unremarkable.  Mild atherosclerotic calcifications of the coronary arteries.  No destructive bony lesions are noted.  Images of the lung parenchyma shows no acute infiltrate or pleural effusion.  No pulmonary edema.  Visualized upper abdomen shows no intrahepatic biliary ductal dilatation.  There is a cyst or hemangioma in the right hepatic lobe measures 8.5 mm.  The visualized tail of the pancreas is unremarkable.  No adrenal gland mass is noted.  IMPRESSION:  1.  No pulmonary embolus. 2.  No acute infiltrate or pulmonary edema. 3.  There is a low density lobulated lesion in the upper anterior mediastinum measures 3 x 2 cm.  A punctate calcification is noted within the lesion.  No aggressive features are noted.  May represent a thymic or pericardial cyst, teratoma or thymic rest. Less likely a pathologic lymph node. Follow-up examination should be based on clinical grounds. 4.  Borderline bilateral axillary lymph nodes.  Original Report Authenticated By: Natasha Mead, M.D.   Disposition: Home with followup as indicated below.  Diet: Heart healthy diet  Activity: Resume as tolerated.   Follow-up Appts: Discharge Orders    Future Appointments: Provider: Department: Dept Phone: Center:   11/29/2011 10:00 AM Waymon Budge, MD [facility] [phone] None     Future Orders Please Complete By Expires   Diet - low sodium heart healthy      Increase activity slowly      Discharge instructions      Comments:   Followup with KAPLAN,KRISTEN, PA, PA-C in 1 week, discuss with your PCP on how to followup on low density lobulated lesion in the upper anterior mediastinum measuring 3 x 2 cm on CT of the chest. Followup with Dr. Dierdre Forth (rheumatology), please call to  make the appointment. Followup with Dermatology at [facility] in 2 weeks.       TESTS THAT NEED FOLLOW-UP None  Time spent on discharge, talking to the patient, and coordinating care: 35 mins.   Signed: Cristal Ford, MD 08/11/2011, 11:25 AM

## 2011-08-11 NOTE — Progress Notes (Signed)
Subjective: Patient continues to have generalized rash improved from yesterday and significantly improved from admission. No other specific complaints.  Objective: Vital signs in last 24 hours: Filed Vitals:   08/10/11 0501 08/10/11 1300 08/10/11 2058 08/11/11 0533  BP: 121/75 117/71 114/69 111/70  Pulse: 82 98 93 76  Temp: 98.6 F (37 C) 98.4 F (36.9 C) 98.7 F (37.1 C) 98.3 F (36.8 C)  TempSrc: Oral  Oral Oral  Resp: 20 16 18 16   Height:      Weight:      SpO2: 96% 96% 95% 96%   Weight change:   Intake/Output Summary (Last 24 hours) at 08/11/11 1116 Last data filed at 08/10/11 2219  Gross per 24 hour  Intake    563 ml  Output    400 ml  Net    163 ml    Physical Exam: General: Awake, Oriented, No acute distress. HEENT: EOMI. Neck: Supple CV: S1 and S2 Lungs: Clear to ascultation bilaterally Abdomen: Soft, Nontender, Nondistended, +bowel sounds. Ext: Good pulses. Trace edema. Skin: Generalized maculopapular blanching erythematous rash on chest, back, arms and legs bilaterally. Rash is worse on her back and arms. Improved from admission.   Lab Results:  Basename 08/10/11 0410 08/09/11 1210  NA 138 134*  K 3.7 3.6  CL 107 100  CO2 21 19  GLUCOSE 141* 115*  BUN 13 16  CREATININE 0.71 0.86  CALCIUM 8.5 8.8  MG -- --  PHOS -- --    Basename 08/09/11 1210  AST 32  ALT 28  ALKPHOS 65  BILITOT 0.3  PROT 7.3  ALBUMIN 3.6   No results found for this basename: LIPASE:2,AMYLASE:2 in the last 72 hours  Basename 08/10/11 0410 08/09/11 1210  WBC 2.0* 3.8*  NEUTROABS -- --  HGB 12.7 15.3*  HCT 37.5 44.3  MCV 90.4 90.6  PLT 258 289    Basename 08/09/11 1219 08/09/11 1210  CKTOTAL -- 46  CKMB -- 1.3  CKMBINDEX -- --  TROPONINI <0.30 --   No components found with this basename: POCBNP:3 No results found for this basename: DDIMER:2 in the last 72 hours No results found for this basename: HGBA1C:2 in the last 72 hours No results found for this  basename: CHOL:2,HDL:2,LDLCALC:2,TRIG:2,CHOLHDL:2,LDLDIRECT:2 in the last 72 hours No results found for this basename: TSH,T4TOTAL,FREET3,T3FREE,THYROIDAB in the last 72 hours No results found for this basename: VITAMINB12:2,FOLATE:2,FERRITIN:2,TIBC:2,IRON:2,RETICCTPCT:2 in the last 72 hours  Micro Results: No results found for this or any previous visit (from the past 240 hour(s)).  Studies/Results: Dg Chest 2 View  08/09/2011  *RADIOLOGY REPORT*  Clinical Data: 66 year old female with lupus.  Weakness, tachycardia, rash.  CHEST - 2 VIEW  Comparison: 10/10/2008 and earlier.  Findings: Normal lung volumes. Normal cardiac size and mediastinal contours.  Visualized tracheal air column is within normal limits. The lungs are clear.  Incidental EKG button artifact in both lungs. No pneumothorax or effusion. No acute osseous abnormality identified.  IMPRESSION: Negative, no acute cardiopulmonary abnormality.  Original Report Authenticated By: Harley Hallmark, M.D.   Ct Angio Chest W/cm &/or Wo Cm  08/09/2011  *RADIOLOGY REPORT*  Clinical Data: Tachycardia, dyspnea, rash, rule out pulmonary embolus  CT ANGIOGRAPHY CHEST  Technique:  Multidetector CT imaging of the chest using the standard protocol during bolus administration of intravenous contrast. Multiplanar reconstructed images including MIPs were obtained and reviewed to evaluate the vascular anatomy.  Contrast: OMNIPAQUE IOHEXOL 300 MG/ML  SOLN  Comparison: None.  Findings: The study is  of excellent technical quality.  No pulmonary embolus is noted.  There is no mediastinal hematoma or adenopathy.  Bilateral axillary lymph nodes are noted.  A left axillary lymph node measures 2.1 x 1.3 cm.  Right axillary lymph node measures 1.3 x 1 cm.  This are borderline by size criteria.  There is a 3 x 2 cm low density lesion in the anterior mediastinum anterior to the pulmonary artery and aorta.  This lesion is in the upper mediastinum.  This is best visualized  in the sagittal image 76 measures 3.1 cm cranial caudally by 2 cm.  A punctate calcification is noted within the lesion.  This lesion has benign features may represent a teratoma, pericardial cyst, thymic cyst or thymic rest.  Less likely a lymph node.  Clinical correlation is necessary. Follow-up examination should be based on clinical grounds.  Central pulmonary artery is unremarkable.  Mild atherosclerotic calcifications of the coronary arteries.  No destructive bony lesions are noted.  Images of the lung parenchyma shows no acute infiltrate or pleural effusion.  No pulmonary edema.  Visualized upper abdomen shows no intrahepatic biliary ductal dilatation.  There is a cyst or hemangioma in the right hepatic lobe measures 8.5 mm.  The visualized tail of the pancreas is unremarkable.  No adrenal gland mass is noted.  IMPRESSION:  1.  No pulmonary embolus. 2.  No acute infiltrate or pulmonary edema. 3.  There is a low density lobulated lesion in the upper anterior mediastinum measures 3 x 2 cm.  A punctate calcification is noted within the lesion.  No aggressive features are noted.  May represent a thymic or pericardial cyst, teratoma or thymic rest. Less likely a pathologic lymph node. Follow-up examination should be based on clinical grounds. 4.  Borderline bilateral axillary lymph nodes.  Original Report Authenticated By: Natasha Mead, M.D.    Medications: I have reviewed the patient's current medications. Scheduled Meds:    . diphenhydrAMINE  25 mg Intravenous Q6H  . hydroxychloroquine  200 mg Oral BID  . LORazepam  0.5 mg Oral Once  . predniSONE  60 mg Oral Q breakfast  . sertraline  100 mg Oral Daily  . 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 Patient had + ANA (1:320 speckled pattern), which was passed on to me from discussion by on-call dermatology at [facility]. Discussed the patient's management at admission with Dr. Sharyn Lull with Dermatology, [facility] on call dermatologist and Dr. Dierdre Forth, rheumatology. Given patient's complicated long course without any improvement in symptoms while being on steroids, however the patient is currently off steroids, Dr. Dierdre Forth, recommended the patient have a trial on high dose steroids IV for 24 to 48 hours.  Patient has had a trial of steroids for 48 hours with some improvement. Transition the patient to prednisone at 60 mg daily. Continue hydroxychloroquine. Dr. Dierdre Forth, indicated that the patient will need further rheumatology workup for systemic lupus and may benefit from being started on mycophenolate, which can be done as outpatient. Hydroxychloroquine and mycophenolate will take weeks to months before they would start having an effect on the patient.  Patient to have outpatient followup with Dr. Dierdre Forth and [facility] dermatology. Will have the patient on scheduled Benadryl with when necessary Benadryl and  hydroxyzine. Urine analysis negative.   Tobacco abuse  Counseled the patient on smoking cessation.   Low density lobulated lesion in the upper anterior mediastinum measures 3 x 2 cm on CT of the chest  Findings were discussed with patient and sister. Uncertain if this has any correlation with patient's generalized rash and new diagnosis of cutaneous lupus. Likely will need outpatient followup under care of primary care physician.   Leukopenia  Stable, uncertain if related to Lupus or hydroxychloroquine.   Mild hyponatremia  Resolved.   Tachycardia  Resolved with gentle hydration. Suspect patient has dehydration from insensible water loss from skin.   Mild dehydration  Resolved.    Polycythemia  Resolved with  hydration.  Likely due to mild dehydration.  Asthma  Stable. Not an active issue at this time.   Prophylaxis  SCDs.   CODE STATUS  Full code.  Disposition Discharge patient home today.   LOS: 2 days  Berlie Persky A, MD 08/11/2011, 11:16 AM

## 2011-08-11 NOTE — Progress Notes (Signed)
Discharge instruction reviewed with patient and family no questions or concerns at this.

## 2011-10-30 ENCOUNTER — Other Ambulatory Visit (HOSPITAL_COMMUNITY): Payer: Self-pay | Admitting: Family Medicine

## 2011-10-30 DIAGNOSIS — M329 Systemic lupus erythematosus, unspecified: Secondary | ICD-10-CM

## 2011-11-04 ENCOUNTER — Ambulatory Visit (HOSPITAL_COMMUNITY)
Admission: RE | Admit: 2011-11-04 | Discharge: 2011-11-04 | Disposition: A | Discharge status: Home / Self Care | Payer: Medicare Other | Source: Ambulatory Visit | Attending: Family Medicine | Admitting: Family Medicine

## 2011-11-04 ENCOUNTER — Other Ambulatory Visit (HOSPITAL_COMMUNITY): Payer: Self-pay | Admitting: Family Medicine

## 2011-11-04 ENCOUNTER — Encounter (HOSPITAL_COMMUNITY): Payer: Self-pay

## 2011-11-04 DIAGNOSIS — K7689 Other specified diseases of liver: Secondary | ICD-10-CM | POA: Insufficient documentation

## 2011-11-04 DIAGNOSIS — R222 Localized swelling, mass and lump, trunk: Secondary | ICD-10-CM | POA: Insufficient documentation

## 2011-11-04 DIAGNOSIS — M329 Systemic lupus erythematosus, unspecified: Secondary | ICD-10-CM

## 2011-11-04 MED ORDER — IOHEXOL 300 MG/ML  SOLN
80.0000 mL | Freq: Once | INTRAMUSCULAR | Status: AC | PRN
  Administered 2011-11-04: 80 mL via INTRAVENOUS

## 2011-11-29 ENCOUNTER — Ambulatory Visit (INDEPENDENT_AMBULATORY_CARE_PROVIDER_SITE_OTHER): Payer: Medicare Other | Admitting: Internal Medicine

## 2011-11-29 ENCOUNTER — Encounter: Payer: Self-pay | Admitting: Internal Medicine

## 2011-11-29 VITALS — BP 122/82 | HR 104 | Ht 62.0 in | Wt 177.6 lb

## 2011-11-29 DIAGNOSIS — J45909 Unspecified asthma, uncomplicated: Secondary | ICD-10-CM

## 2011-11-29 DIAGNOSIS — F17200 Nicotine dependence, unspecified, uncomplicated: Secondary | ICD-10-CM

## 2011-11-29 DIAGNOSIS — Z720 Tobacco use: Secondary | ICD-10-CM

## 2011-11-29 DIAGNOSIS — R21 Rash and other nonspecific skin eruption: Secondary | ICD-10-CM

## 2011-11-29 DIAGNOSIS — J301 Allergic rhinitis due to pollen: Secondary | ICD-10-CM

## 2011-11-29 DIAGNOSIS — R9389 Abnormal findings on diagnostic imaging of other specified body structures: Secondary | ICD-10-CM

## 2011-11-29 MED ORDER — FLUTICASONE-SALMETEROL 100-50 MCG/DOSE IN AEPB: 1.0000 | INHALATION_SPRAY | Freq: Two times a day (BID) | RESPIRATORY_TRACT | Status: DC

## 2011-11-29 NOTE — Patient Instructions (Addendum)
Order- schedule PFT   Dx asthma  Order- refer to Thoracic Surgery  Dx anterior mediastinal mass

## 2011-11-29 NOTE — Progress Notes (Signed)
Subjective:    Patient ID: [name] [name], female    DOB: [birth year], 66 y.o.   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 No asthma flares. Admits only nasal congestion if outside at some times of year. Occasionally will supplement with a benadryl.  Continues allergy vaccine without problems. Continues Advair bid, but never needs the rescue inhaler.  11/29/11- 5 yoF former smoker followed for asthma, allergic rhinitis   PCP K.Arlyce Dice, PAC. 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  Also had a CT done  10/30/11.  Denies any problems with allergies and asthma. DC'd allergy vaccine 6 months ago when her lupus rash began. Asthma control good on Advair. Hospitalized April, 2013-[facility]dehydration.  CTw/cm 11/04/11 IMPRESSION:  Stable soft tissue mass in the anterior-superior mediastinum.  Continued follow-up with CT in 6 - 8 months could be performed to  document stability, as the etiology is uncertain.  Interval decrease in size of axillary adenopathy; normal now.  Original Report Authenticated By: Brandon Melnick, M.D.   Review of Systems-see HPI Constitutional:   No-   weight loss, night sweats, fevers, chills, fatigue, lassitude. HEENT:   No-  headaches, difficulty swallowing, tooth/dental problems, sore throat,       No-  sneezing, itching, ear ache, nasal congestion, post nasal drip,  CV:  No-   chest pain, orthopnea, PND, swelling in lower extremities, anasarca, dizziness, palpitations Resp: No-   shortness of breath with exertion or at rest.              No-   productive cough,  No non-productive cough,  No-  coughing up of blood.              No-   change in color of mucus.  No- wheezing.   Skin: + Hyperpigmented areas on calves. GI:  No-   heartburn, indigestion, abdominal pain, nausea, vomiting,  GU:    MS:  No-   joint pain or swelling.   Neuro- nothing unusual  Psych:  No- change in mood or affect. No depression or anxiety.  No memory loss.     Objective:   Physical Exam General- Alert, Oriented, Affect-appropriate, Distress- none acute Skin- rash-none, lesions- none, excoriation- none Lymphadenopathy- none Head- atraumatic            Eyes- Gross vision intact, PERRLA, conjunctivae clear secretions            Ears- Hearing, canals normal            Nose- Clear, No- Septal dev, mucus, polyps, erosion, perforation             Throat- Mallampati III , mucosa clear , drainage- none, tonsils- atrophic Neck- flexible , trachea midline, no stridor , thyroid nl, carotid no bruit Chest - symmetrical excursion , unlabored           Heart/CV- RRR , no murmur , no gallop  , no rub, nl s1 s2                           - JVD- none , edema- none, stasis changes- none, varices- none           Lung- clear to P&A, wheeze- none, cough- none , dullness-none, rub- none  Chest wall-  Abd- Br/ Gen/ Rectal- Not done, not indicated Extrem- cyanosis- none, clubbing, none, atrophy- none, strength- nl Neuro- grossly intact to observation  Assessment & Plan:

## 2011-12-04 ENCOUNTER — Institutional Professional Consult (permissible substitution) (INDEPENDENT_AMBULATORY_CARE_PROVIDER_SITE_OTHER): Payer: Medicare Other | Admitting: Cardiothoracic Surgery

## 2011-12-04 ENCOUNTER — Encounter: Payer: Self-pay | Admitting: Cardiothoracic Surgery

## 2011-12-04 VITALS — BP 144/86 | HR 110 | Resp 18 | Ht 62.0 in | Wt 170.0 lb

## 2011-12-04 DIAGNOSIS — J9859 Other diseases of mediastinum, not elsewhere classified: Secondary | ICD-10-CM

## 2011-12-04 DIAGNOSIS — R222 Localized swelling, mass and lump, trunk: Secondary | ICD-10-CM

## 2011-12-04 NOTE — Progress Notes (Signed)
[address]            [name] [address]          [phone]      KAZANDRA FORSTROM [facility] Medical Record #[id number] Date of Birth: 1945/10/11  Referring: Waymon Budge, MD Primary Care: Mady Gemma, [place]  Chief Complaint:    Chief Complaint  Patient presents with  . Mediastinal Mass    referral from Dr Fannie Knee for eval on anterior mediastinal mass, Chest CT 11/04/2011  unchanged since ct of 06/2011  History of Present Illness:    66 year old female who was admitted to hospital in march of 2013.     with history of tobacco use, allergic rhinitis, with new diagnosis of cutaneous lupus . Patient reports that she had a urologic procedure done approximately 5 months  ago and was given trimethoprim as antibiotic. Subsequently after that she noted a generalized rash. The rash has gotten worse and she has gone to her dermatologist Dr. Sharyn Lull and has had multiple skin biopsies done and was eventually [facility]. She had biopsies and labs done which indicated that the patient has cutaneous lupus, labs and biopsy not available for review. These findings were discussed with on call dermatology at [facility]. Patient has been on multiple trials of prednisone without any improvement in her symptom.  She denies any recent fevers, chills, vomiting, chest pain, shortness of breath, abdominal pain, diarrhea, headaches or vision changes. She does report having nausea today which has resolved. Patient's sister noted that she is slightly out of breath but denied any shortness of breath on my interview with the patient.   On the admission in March ct of chest was done and comparied to recent ct   Current Activity/ Functional Status:  Patient is  independent with mobility/ambulation, transfers, ADL's, IADL's.   Past Medical History  Diagnosis Date  . Unspecified asthma   . Allergic rhinitis, cause  unspecified   . Right ear pain   . Lupus (systemic lupus erythematosus)     Past Surgical History  Procedure Date  . No past surgeries     Family History  Problem Relation Age of Onset  . Heart disease Mother   . Heart disease Brother     History   Social History  . Marital Status: Divorced    Spouse Name: N/A    Number of Children: 0  . Years of Education: N/A   Occupational History  . Retired    Social History Main Topics  . Smoking status: Former Smoker    Types: Cigarettes    Quit date: 06/21/2011  . Smokeless tobacco: Never Used   Comment: Quit 4 weeks ago.  . Alcohol Use: Yes     Occasionally     History  Smoking status  . Former Smoker  . Types: Cigarettes  . Quit date: 06/21/2011  Smokeless tobacco  . Never Used  Comment: Quit 4 weeks ago.    History  Alcohol Use  . Yes    Occasionally     No Known Allergies  Current Outpatient Prescriptions  Medication Sig Dispense Refill  . diphenhydrAMINE (BENADRYL) 25 MG tablet Take 50 mg by mouth every 6 (six) hours as needed. For skin irritation      . Fluticasone-Salmeterol (ADVAIR) 100-50 MCG/DOSE AEPB Inhale 1 puff into the lungs  every 12 (twelve) hours. Rinse mouth  60 each  prn  . hydroxychloroquine (PLAQUENIL) 200 MG tablet Take 200 mg by mouth 2 (two) times daily.      . sertraline (ZOLOFT) 100 MG tablet Take 100 mg by mouth daily.      Marland Kitchen triamcinolone cream (KENALOG) 0.1 % Apply 1 application topically daily as needed. For skin relief      . albuterol (PROAIR HFA) 108 (90 BASE) MCG/ACT inhaler Inhale 2 puffs into the lungs 4 (four) times daily as needed for shortness of breath.  1 Inhaler  prn       Review of Systems:     Cardiac Review of Systems: Y or N  Chest Pain [  n  ]  Resting SOB [n   ] Exertional SOB  [ y ]  Pollyann Kennedy Milo.Brash  ]   Pedal Edema [ n  ]    Palpitations [ n ] Syncope  [ n ]   Presyncope [n   ]  General Review of Systems: [Y] = yes [  ]=no Constitional: recent weight  change [  ]; anorexia [  ]; fatigue [ n ]; nausea [  ]; night sweats [  ]; fever [ n ]; or chills [ n ];                                                                                                                                         Dental: poor dentition[n  ]; Last Dentist visit:  Eye : blurred vision [  ]; diplopia [   ]; vision changes [  ];  Amaurosis fugax[  ]; Resp: cough [  ];  wheezing[  ];  hemoptysis[  ]; shortness of breath[  ]; paroxysmal nocturnal dyspnea[  ]; dyspnea on exertion[  ]; or orthopnea[  ];  GI:  gallstones[  ], vomiting[  ];  dysphagia[  ]; melena[  ];  hematochezia [  ]; heartburn[  ];   Hx of  Colonoscopy[  ]; GU: kidney stones [  ]; hematuria[  ];   dysuria [  ];  nocturia[  ];  history of     obstruction [  ];             Skin: rash yes, swelling[  ];, hair loss[  ];  peripheral edema[  ];  or itching[  ]; Musculosketetal: myalgias[  ];  joint swelling[  ];  joint erythema[  ];  joint pain[ y ];  back pain[  ];  Heme/Lymph: bruising[  ];  bleeding[  ];  anemia[  ];  Neuro: TIA[  ];  headaches[  ];  stroke[  ];  vertigo[  ];  seizures[  ];   paresthesias[  ];  difficulty walking[  ];  Psych:depression[  ]; anxiety[  ];  Endocrine: diabetes[  ];  thyroid dysfunction[  ];  Immunizations: Flu [?  ];  Pneumococcal[  ?];  Other:  Physical Exam: BP 144/86  Pulse 110  Resp 18  Ht 5\' 2"  (1.575 m)  Wt 170 lb (77.111 kg)  BMI 31.09 kg/m2  SpO2 98%  General appearance: alert, cooperative and appears stated age Neurologic: intact Heart: regular rate and rhythm, S1, S2 normal, no murmur, click, rub or gallop and normal apical impulse Lungs: clear to auscultation bilaterally and normal percussion bilaterally Abdomen: soft, non-tender; bowel sounds normal; no masses,  no organomegaly Extremities: extremities normal, atraumatic, no cyanosis or edema and Homans sign is negative, no sign of DVT   Diagnostic Studies & Laboratory data:     Recent Radiology Findings:    Technique: Multidetector CT imaging of the chest was performed  following the standard protocol during bolus administration of  intravenous contrast.  Contrast: 80mL OMNIPAQUE IOHEXOL 300 MG/ML SOLN  Comparison: August 09, 2011  Findings: Again noted within the upper mediastinum is a lobulated  soft tissue mass with central coarse calcification which measures  3.2 x 1.9 x 2.9 cm. The size and appearance are unchanged.  Both lungs are clear with no evidence of interstitial disease,  pleural effusion, or infiltrate. No pericardial effusion is  present. The pulmonary arteries are normal in caliber, and there  are no filling defects within either pulmonary arterial system.  The thoracic aorta is normal in caliber and appearance. Mild  coronary artery and aortic atherosclerotic calcification is noted.  Axillary lymph nodes have decreased in size bilaterally and are  normal now.  The osseous structures and visualized upper abdomen are  unremarkable. There is a stable simple cyst in the right lobe of  the liver.  IMPRESSION:  Stable soft tissue mass in the anterior-superior mediastinum.  Continued follow-up with CT in 6 - 8 months could be performed to  document stability, as the etiology is uncertain.    Recent Lab Findings: Lab Results  Component Value Date   WBC 2.0* 08/10/2011   HGB 12.7 08/10/2011   HCT 37.5 08/10/2011   PLT 258 08/10/2011   GLUCOSE 141* 08/10/2011   ALT 28 08/09/2011   AST 32 08/09/2011   NA 138 08/10/2011   K 3.7 08/10/2011   CL 107 08/10/2011   CREATININE 0.71 08/10/2011   BUN 13 08/10/2011   CO2 21 08/10/2011   INR 1.00 08/09/2011      Assessment / Plan:      Stable mediastinal mass with calcification incidental noted on CT 07/2011 and unchanged 10/2011 i have discussed with the patient surgical options, She is agreeable with fu ct of the chest in [address]      Delight Ovens MD  Beeper [phone] Office [phone] 12/04/2011 10:37 PM

## 2011-12-07 ENCOUNTER — Encounter: Payer: Self-pay | Admitting: Internal Medicine

## 2011-12-07 NOTE — Assessment & Plan Note (Signed)
Strongly encouraged to stay off of all tobacco.

## 2011-12-07 NOTE — Assessment & Plan Note (Signed)
Advair refilled with discussion.

## 2011-12-07 NOTE — Assessment & Plan Note (Addendum)
Images were reviewed with her. Since this lesion is not readily identified in a former smoker, I have recommended referral for thoracic surgery opinion.

## 2011-12-07 NOTE — Assessment & Plan Note (Signed)
Appropriate to stay off of vaccine. Symptomatic medicine as needed.

## 2012-03-09 ENCOUNTER — Other Ambulatory Visit: Payer: Self-pay | Admitting: Family Medicine

## 2012-03-09 DIAGNOSIS — R1031 Right lower quadrant pain: Secondary | ICD-10-CM

## 2012-03-12 ENCOUNTER — Ambulatory Visit
Admission: RE | Admit: 2012-03-12 | Discharge: 2012-03-12 | Disposition: A | Discharge status: Home / Self Care | Payer: Medicare Other | Source: Ambulatory Visit | Attending: Family Medicine | Admitting: Family Medicine

## 2012-03-12 DIAGNOSIS — R1031 Right lower quadrant pain: Secondary | ICD-10-CM

## 2012-03-30 ENCOUNTER — Encounter: Payer: Self-pay | Admitting: Internal Medicine

## 2012-03-30 ENCOUNTER — Ambulatory Visit (INDEPENDENT_AMBULATORY_CARE_PROVIDER_SITE_OTHER): Payer: Medicare Other | Admitting: Internal Medicine

## 2012-03-30 VITALS — BP 126/76 | HR 98 | Ht 62.0 in | Wt 179.6 lb

## 2012-03-30 DIAGNOSIS — J45909 Unspecified asthma, uncomplicated: Secondary | ICD-10-CM

## 2012-03-30 DIAGNOSIS — Z720 Tobacco use: Secondary | ICD-10-CM

## 2012-03-30 DIAGNOSIS — J4520 Mild intermittent asthma, uncomplicated: Secondary | ICD-10-CM

## 2012-03-30 DIAGNOSIS — J301 Allergic rhinitis due to pollen: Secondary | ICD-10-CM

## 2012-03-30 DIAGNOSIS — F17200 Nicotine dependence, unspecified, uncomplicated: Secondary | ICD-10-CM

## 2012-03-30 DIAGNOSIS — Z23 Encounter for immunization: Secondary | ICD-10-CM

## 2012-03-30 MED ORDER — FLUTICASONE-SALMETEROL 100-50 MCG/DOSE IN AEPB: 1.0000 | INHALATION_SPRAY | Freq: Two times a day (BID) | RESPIRATORY_TRACT | Status: AC

## 2012-03-30 MED ORDER — AZELASTINE-FLUTICASONE 137-50 MCG/ACT NA SUSP: 1.0000 | NASAL | Status: DC

## 2012-03-30 NOTE — Progress Notes (Signed)
Subjective:    Patient ID: [name] [name], female    DOB: 1945-10-06, 66 y.o.   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 No asthma flares. Admits only nasal congestion if outside at some times of year. Occasionally will supplement with a benadryl.  Continues allergy vaccine without problems. Continues Advair bid, but never needs the rescue inhaler.  11/29/11- 47 yoF former smoker followed for asthma, allergic rhinitis   PCP K.Arlyce Dice, PAC. 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  Also had a CT done  10/30/11.  Denies any problems with allergies and asthma. DC'd allergy vaccine 6 months ago when her lupus rash began. Asthma control good on Advair. Hospitalized April, 2013-[facility]dehydration.  CTw/cm 11/04/11 IMPRESSION:  Stable soft tissue mass in the anterior-superior mediastinum.  Continued follow-up with CT in 6 - 8 months could be performed to  document stability, as the etiology is uncertain.  Interval decrease in size of axillary adenopathy; normal now.  Original Report Authenticated By: Brandon Melnick, M.D.   03/30/12- 5 yoF former smoker followed for asthma, allergic rhinitis, complicated by lupus   PCP K.Arlyce Dice, PAC. [facility] [place] FOLLOWS FOR: no longer on vaccine(about 1 year now); no flare ups Allergy vaccine stopped when lupus dx'd. Lupus on facial skin flares occasionally. Treats with benadryl lotion by dermatologist at [facility].  Allergic rhinitis well controlled except perennial drip.  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 Asthma control is god- needs Advair refill.  CT chest 11/01/11- reviewed IMPRESSION:  Stable soft tissue mass in the anterior-superior mediastinum.  Continued follow-up with CT in 6 - 8 months  could be performed to  document stability, as the etiology is uncertain.  Interval decrease in size of axillary adenopathy; normal now.  Original Report Authenticated By: Brandon Melnick, M.D.   Review of Systems-see HPI Constitutional:   No-   weight loss, night sweats, fevers, chills, fatigue, lassitude. HEENT:   No-  headaches, difficulty swallowing, tooth/dental problems, sore throat,       No-  sneezing, itching, ear ache, nasal congestion, +post nasal drip,  CV:  No-   chest pain, orthopnea, PND, swelling in lower extremities, anasarca, dizziness, palpitations Resp: No-   shortness of breath with exertion or at rest.              No-   productive cough,  No non-productive cough,  No-  coughing up of blood.              No-   change in color of mucus.  No- wheezing.   Skin: + Hyperpigmented areas on calves. GI:  No-   heartburn, indigestion, abdominal pain, nausea, vomiting,  GU:  MS:  No-   joint pain or swelling.   Neuro- nothing unusual  Psych:  No- change in mood or affect. No depression or anxiety.  No memory loss.     Objective:   Physical Exam General- Alert, Oriented, Affect-appropriate, Distress- none acute Skin- rash-none, lesions- none, excoriation- none Lymphadenopathy- none Head- atraumatic            Eyes- Gross vision intact, PERRLA, conjunctivae clear secretions            Ears- Hearing, canals normal  Nose- Clear, No- Septal dev, mucus, polyps, erosion, perforation             Throat- Mallampati III , mucosa clear , drainage- none, tonsils- atrophic Neck- flexible , trachea midline, no stridor , thyroid nl, carotid no bruit Chest - symmetrical excursion , unlabored           Heart/CV- RRR , no murmur , no gallop  , no rub, nl s1 s2                           - JVD- none , edema- none, stasis changes- none, varices- none           Lung- clear to P&A, wheeze- none, cough- none , dullness-none, rub- none           Chest wall-  Abd- Br/ Gen/ Rectal- Not  done, not indicated Extrem- cyanosis- none, clubbing, none, atrophy- none, strength- nl Neuro- grossly intact to observation  Assessment & Plan:

## 2012-03-30 NOTE — Patient Instructions (Addendum)
Script sent to refill Advair 100      Sample Dymista nasal spray     1-2 puffs each nostril once daily at bedtime  Please call as needed  Flu vax

## 2012-04-06 NOTE — Assessment & Plan Note (Signed)
Remains off cigarettes as of this date.

## 2012-04-06 NOTE — Assessment & Plan Note (Signed)
Bothersome perennial drip. Plan- sample Dymista nasal spray

## 2012-04-06 NOTE — Assessment & Plan Note (Signed)
Good control Plan refill Advair with discussion.

## 2012-04-27 ENCOUNTER — Other Ambulatory Visit: Payer: Self-pay | Admitting: Cardiothoracic Surgery

## 2012-04-27 DIAGNOSIS — R222 Localized swelling, mass and lump, trunk: Secondary | ICD-10-CM

## 2012-04-28 DIAGNOSIS — L659 Nonscarring hair loss, unspecified: Secondary | ICD-10-CM | POA: Insufficient documentation

## 2012-06-04 ENCOUNTER — Other Ambulatory Visit: Payer: Medicare Other

## 2012-06-04 ENCOUNTER — Ambulatory Visit: Payer: Medicare Other | Admitting: Cardiothoracic Surgery

## 2012-07-09 ENCOUNTER — Other Ambulatory Visit: Payer: Self-pay | Admitting: *Deleted

## 2012-07-09 ENCOUNTER — Ambulatory Visit (INDEPENDENT_AMBULATORY_CARE_PROVIDER_SITE_OTHER): Payer: Medicare HMO | Admitting: Cardiothoracic Surgery

## 2012-07-09 ENCOUNTER — Ambulatory Visit
Admission: RE | Admit: 2012-07-09 | Discharge: 2012-07-09 | Disposition: A | Discharge status: Home / Self Care | Payer: Medicare HMO | Source: Ambulatory Visit | Attending: Cardiothoracic Surgery | Admitting: Cardiothoracic Surgery

## 2012-07-09 VITALS — BP 136/72 | HR 116 | Resp 16 | Ht 62.0 in | Wt 165.0 lb

## 2012-07-09 DIAGNOSIS — R222 Localized swelling, mass and lump, trunk: Secondary | ICD-10-CM

## 2012-07-09 DIAGNOSIS — Z7189 Other specified counseling: Secondary | ICD-10-CM

## 2012-07-09 NOTE — Progress Notes (Signed)
[address]            [place] [address]          [phone]      [name] [name] [facility] Medical Record #[id number] Date of Birth: 14-Jan-1946  Referring: Waymon Budge, MD Primary Care: Lilia Argue  Chief Complaint:    Chief Complaint  Patient presents with  . Mediastinal Mass    6 month f/u with CT CHEST  unchanged since ct of 06/2011  History of Present Illness:    67 year old female who was admitted to hospital in march of 2013.     with history of tobacco use, allergic rhinitis, with new diagnosis of cutaneous lupus . Patient reports that she had a urologic procedure done approximately 5 months  ago and was given trimethoprim as antibiotic. Subsequently after that she noted a generalized rash. The rash has gotten worse and she has gone to her dermatologist Dr. Sharyn Lull and has had multiple skin biopsies done and was eventually referred to [facility]. She had biopsies and labs done which indicated that the patient has cutaneous lupus, labs and biopsy not available for review. These findings were discussed with on call dermatology at [facility]. Patient has been on multiple trials of prednisone without any improvement in her symptom.  She denies any recent fevers, chills, vomiting, chest pain, shortness of breath, abdominal pain, diarrhea, headaches or vision changes. She does report having nausea today which has resolved. Patient's sister noted that she is slightly out of breath but denied any shortness of breath on my interview with the patient.   On the admission in March ct of chest was done and comparied to recent ct and compared to the CT scan done today.   Current Activity/ Functional Status:  Patient is  independent with mobility/ambulation, transfers, ADL's, IADL's.   Past Medical History  Diagnosis Date  . Unspecified asthma   . Allergic rhinitis, cause unspecified   . Right  ear pain   . Lupus (systemic lupus erythematosus)     Past Surgical History  Procedure Laterality Date  . No past surgeries      Family History  Problem Relation Age of Onset  . Heart disease Mother   . Heart disease Brother     History   Social History  . Marital Status: Divorced    Spouse Name: N/A    Number of Children: 0  . Years of Education: N/A   Occupational History  . Retired    Social History Main Topics  . Smoking status: Former Smoker    Types: Cigarettes    Quit date: 06/21/2011  . Smokeless tobacco: Never Used   Comment: Quit 4 weeks ago.  . Alcohol Use: Yes     Occasionally     History  Smoking status  . Former Smoker -- 0.50 packs/day for 30 years  . Types: Cigarettes  . Quit date: 06/21/2011  Smokeless tobacco  . Never Used    Comment: Quit 4 weeks ago.    History  Alcohol Use  . Yes    Comment: Occasionally     No Known Allergies  Current Outpatient Prescriptions  Medication Sig Dispense Refill  . albuterol-ipratropium (COMBIVENT) 18-103 MCG/ACT inhaler Inhale 2 puffs into the lungs every 4 (four) hours as needed for wheezing.      Marland Kitchen  Azelastine-Fluticasone 137-50 MCG/ACT SUSP Place 1 spray into the nose as directed.  1 Bottle  0  . diphenhydrAMINE (BENADRYL) 25 MG tablet Take 50 mg by mouth every 6 (six) hours as needed. For skin irritation      . ESZOPICLONE 3 MG tablet Take 1 tablet by mouth At bedtime as needed.      . Fluticasone-Salmeterol (ADVAIR) 100-50 MCG/DOSE AEPB Inhale 1 puff into the lungs every 12 (twelve) hours. Rinse mouth  60 each  prn  . hydroxychloroquine (PLAQUENIL) 200 MG tablet Take 200 mg by mouth 2 (two) times daily.      . sertraline (ZOLOFT) 100 MG tablet Take 100 mg by mouth daily.      Marland Kitchen triamcinolone cream (KENALOG) 0.1 % Apply 1 application topically daily as needed. For skin relief      . zolpidem (AMBIEN) 10 MG tablet Take 1 tablet by mouth At bedtime as needed.       No current facility-administered  medications for this visit.       Review of Systems:     Cardiac Review of Systems: Y or N  Chest Pain [  n  ]  Resting SOB [n   ] Exertional SOB  [ y ]  Pollyann Kennedy Milo.Brash  ]   Pedal Edema [ n  ]    Palpitations [ n ] Syncope  [ n ]   Presyncope [n   ]  General Review of Systems: [Y] = yes [  ]=no Constitional: recent weight change [  ]; anorexia [  ]; fatigue [ n ]; nausea [  ]; night sweats [  ]; fever [ n ]; or chills [ n ];                                                                                                                                         Dental: poor dentition[n  ]; Last Dentist visit:  Eye : blurred vision [  ]; diplopia [   ]; vision changes [  ];  Amaurosis fugax[  ]; Resp: cough [  ];  wheezing[  ];  hemoptysis[  ]; shortness of breath[  ]; paroxysmal nocturnal dyspnea[  ]; dyspnea on exertion[  ]; or orthopnea[  ];  GI:  gallstones[  ], vomiting[  ];  dysphagia[  ]; melena[  ];  hematochezia [  ]; heartburn[  ];   Hx of  Colonoscopy[  ]; GU: kidney stones [  ]; hematuria[  ];   dysuria [  ];  nocturia[  ];  history of     obstruction [  ];             Skin: rash yes, swelling[  ];, hair loss[  ];  peripheral edema[  ];  or itching[  ]; Musculosketetal: myalgias[  ];  joint swelling[  ];  joint erythema[  ];  joint pain[  y ];  back pain[  ];  Heme/Lymph: bruising[  ];  bleeding[  ];  anemia[  ];  Neuro: TIA[  ];  headaches[  ];  stroke[  ];  vertigo[  ];  seizures[  ];   paresthesias[  ];  difficulty walking[  ];  Psych:depression[  ]; anxiety[  ];  Endocrine: diabetes[  ];  thyroid dysfunction[  ];  Immunizations: Flu [?  ]; Pneumococcal[  ?];  Other:  Physical Exam: BP 136/72  Pulse 116  Resp 16  Ht 5\' 2"  (1.575 m)  Wt 165 lb (74.844 kg)  BMI 30.17 kg/m2  SpO2 96%  General appearance: alert, cooperative and appears stated age Neurologic: intact Heart: regular rate and rhythm, S1, S2 normal, no murmur, click, rub or gallop and normal apical  impulse Lungs: clear to auscultation bilaterally and normal percussion bilaterally Abdomen: soft, non-tender; bowel sounds normal; no masses,  no organomegaly Extremities: extremities normal, atraumatic, no cyanosis or edema and Homans sign is negative, no sign of DVT   Diagnostic Studies & Laboratory data:     Recent Radiology Findings:  Ct Chest Wo Contrast  07/09/2012  *RADIOLOGY REPORT*  Clinical Data: Anterior mediastinal soft tissue mass on CT angio chest  CT CHEST WITHOUT CONTRAST  Technique:  Multidetector CT imaging of the chest was performed following the standard protocol without IV contrast.  Comparison: CT angio chest of 11/04/2011  Findings: On the lung window images no lung parenchymal lesion is seen.  No lung nodule is noted.  There is no evidence of pleural effusion.  On soft tissue window images, the thyroid gland is unremarkable. On this unenhanced study the anterior mediastinal soft tissue mass is again noted.  This mass has lobular contours and measures approximately 3.1 x 2.2 x 3.1 cm, lying just anterior to the main pulmonary artery.  In view of the calcification within this soft tissue mass, the primary considerations would be that of teratoma or thymoma.  This mass does not appear to be vascular in origin, with fat planes separating this mass from the ascending aorta and the main pulmonary artery.  No mediastinal or hilar adenopathy is seen.  There are coronary artery calcifications in the distribution of the left anterior descending artery.  No abnormality of upper abdomen is seen.  No bony abnormality is noted.  IMPRESSION:  1.  Lobular soft tissue mass within the anterior mediastinum of 3.1 x 2.2 x 3.1 cm with internal calcifications.  The primary considerations are that of teratoma or thymoma. 2.  No mediastinal or hilar adenopathy.   Original Report Authenticated By: Dwyane Dee, M.D.        Technique: Multidetector CT imaging of the chest was performed  following the  standard protocol during bolus administration of  intravenous contrast.  Contrast: 80mL OMNIPAQUE IOHEXOL 300 MG/ML SOLN  Comparison: August 09, 2011  Findings: Again noted within the upper mediastinum is a lobulated  soft tissue mass with central coarse calcification which measures  3.2 x 1.9 x 2.9 cm. The size and appearance are unchanged.  Both lungs are clear with no evidence of interstitial disease,  pleural effusion, or infiltrate. No pericardial effusion is  present. The pulmonary arteries are normal in caliber, and there  are no filling defects within either pulmonary arterial system.  The thoracic aorta is normal in caliber and appearance. Mild  coronary artery and aortic atherosclerotic calcification is noted.  Axillary lymph nodes have decreased in size bilaterally and are  normal now.  The osseous structures and visualized upper abdomen are  unremarkable. There is a stable simple cyst in the right lobe of  the liver.  IMPRESSION:  Stable soft tissue mass in the anterior-superior mediastinum.  Continued follow-up with CT in 6 - 8 months could be performed to  document stability, as the etiology is uncertain.    Recent Lab Findings: Lab Results  Component Value Date   WBC 2.0* 08/10/2011   HGB 12.7 08/10/2011   HCT 37.5 08/10/2011   PLT 258 08/10/2011   GLUCOSE 141* 08/10/2011   ALT 28 08/09/2011   AST 32 08/09/2011   NA 138 08/10/2011   K 3.7 08/10/2011   CL 107 08/10/2011   CREATININE 0.71 08/10/2011   BUN 13 08/10/2011   CO2 21 08/10/2011   INR 1.00 08/09/2011      Assessment / Plan:      Stable mediastinal mass with calcification incidental noted on CT 07/2011 and unchanged 10/2011 in patient with newly diagnosed lupus, cutaneous. 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 i have discussed with the patient surgical options, She is agreeable to proceeding with surgical resection tentatively planned for April 2.     Delight Ovens MD  Beeper [phone] Office [phone] 07/09/2012 1:54 PM

## 2012-07-09 NOTE — Patient Instructions (Signed)
Thoracoscopy Thoracoscopy is a procedure in which a thin, lighted tube (thoracoscope) is put through a small cut (incision) in the chest wall. This procedure makes it possible for your caregiver to look at the lungs or other structures in the chest cavity and to do some minor operations. This is a more minor procedure than thoracotomy, which opens the chest cavity with a large incision. Thoracoscopy can sometimes be used instead of thoracotomy. Thoracoscopy usually involves less pain, a shorter hospital stay, and a shorter recovery time. Common reasons for this procedure are:  To study diseases or problems in the chest.  To take a tissue sample (biopsy) to study under a microscope.  To put medicines directly into the lungs.  To remove collections of fluid, pus (empyema), or blood in the chest. LET YOUR CAREGIVER KNOW ABOUT:   Allergies to food or medicine.  Medicines taken, including vitamins, herbs, eyedrops, over-the-counter medicines, and creams.  Use of steroids (by mouth or creams).  Previous problems with anesthetics or numbing medicines.  History of bleeding problems or blood clots.  Previous surgery.  Any history of heart problems.  Other health problems, including diabetes and kidney problems.  Possibility of pregnancy, if this applies. RISKS AND COMPLICATIONS   If too much bleeding occurs, or if the surgery turns out to be major, it may be necessary to open the chest (thoracotomy) to control the bleeding.  There is a risk of injury to nerves or other structures in the chest as a result of the placement of the instruments.  When the chest tube is removed, the lung may collapse (pneumothorax). If this happens, the tube may need to be reinserted and left in place until a time when the lung will remain expanded as the tube is removed. BEFORE THE PROCEDURE   You may have routine tests done, such as blood tests, urine tests, and chest X-rays.  Electrocardiography (EKG) to  record the electrical activity of the heart may be done to make sure the heart is okay.  Do not eat or drink after midnight the night before the procedure. Medicine given before the procedure that makes you sleep (general anesthetic) may cause vomiting. A patient who vomits is in danger of inhaling food into the lungs. This can cause serious complications and can be life-threatening. PROCEDURE  Video-assisted thoracic surgery (VATS) is surgery using a thoracoscope with a small video camera on the end. The picture from inside the chest is displayed on a television screen for the surgeon to see. The lung being worked on is collapsed and several small incisions are made to insert instruments. The surgeon can manipulate the instruments while watching on the television screen. The thoracoscope may be removed and put into different areas as needed. When the procedure is finished, the surgeon expands the lung and puts one or more chest tubes in the chest. The chest tubes allow the lung to expand and allow fluid (drainage) to come out. The remaining incisions are closed with stitches (sutures) or staples. AFTER THE PROCEDURE   The chest tube is left in place for 1 to several days to drain fluid or air from the chest cavity.  Hospital stays range from 1 to 5 days depending on the procedure and treatment. Document Released: 01/05/2003 Document Revised: 07/01/2011 Document Reviewed: 09/26/2010 [facility] Patient Information 2013 [place], [place]. Thoracoscopy Care After Refer to this sheet in the next few weeks. These discharge instructions provide you with general information on caring for yourself after you leave the  hospital. Your caregiver may also give you specific instructions. Your treatment has been planned according to the most current medical practices available, but unavoidable complications sometimes occur. If you have any problems or questions after discharge, call your caregiver. HOME CARE  INSTRUCTIONS   Remove the bandage (dressing) over your chest tube site as directed by your caregiver.  It is normal to be sore for a couple weeks following surgery. See your caregiver if this seems to be getting worse rather than better.  Only take over-the-counter or prescription medicines for pain, discomfort, or fever as directed by your caregiver. It is very important to take pain medicine when you need it so that you will cough and breathe deeply enough to clear mucus (phlegm) and expand your lungs.  If it hurts to cough, hold a pillow against your chest when you cough. This may help with the discomfort. In spite of the discomfort, cough frequently, as this helps protect against getting an infection in your lung (pneumonia).  Taking deep breaths keeps lungs inflated and protects against pneumonia. Most patients will go home with an incentive spirometer that encourages deep breathing.  You may resume a normal diet and activities as directed.  Use showers for bathing until you see your caregiver, or as instructed.  Change dressings if necessary or as directed.  Avoid lifting or driving until you are instructed otherwise.  Make an appointment to see your caregiver for stitch (suture) or staple removal when instructed.  Do not travel by airplane for 2 weeks after the chest tube is removed. SEEK MEDICAL CARE IF:   You are bleeding from your wounds.  You have redness, swelling, or increasing pain in the wounds.  Your heartbeat feels irregular or very fast.  There is pus coming from your wounds.  There is a bad smell coming from the wound or dressing. SEEK IMMEDIATE MEDICAL CARE IF:   You have a fever.  You develop a rash.  You have difficulty breathing.  You develop any reaction or side effects to medicines given.  You develop lightheadedness or feel faint.  You develop shortness of breath or chest pain. MAKE SURE YOU:   Understand these instructions.  Will watch  your condition.  Will get help right away if you are not doing well or get worse. Document Released: 10/26/2004 Document Revised: 07/01/2011 Document Reviewed: 09/26/2010 [facility] Patient Information 2013 [place], [place].

## 2012-07-11 ENCOUNTER — Encounter: Payer: Self-pay | Admitting: Cardiothoracic Surgery

## 2012-07-15 ENCOUNTER — Encounter (HOSPITAL_COMMUNITY): Payer: Self-pay

## 2012-07-20 ENCOUNTER — Ambulatory Visit (HOSPITAL_COMMUNITY)
Admission: RE | Admit: 2012-07-20 | Discharge: 2012-07-20 | Disposition: A | Discharge status: Home / Self Care | Payer: Medicare PPO | Source: Ambulatory Visit | Attending: Cardiothoracic Surgery | Admitting: Cardiothoracic Surgery

## 2012-07-20 ENCOUNTER — Encounter (HOSPITAL_COMMUNITY): Payer: Self-pay

## 2012-07-20 ENCOUNTER — Encounter (HOSPITAL_COMMUNITY)
Admission: RE | Admit: 2012-07-20 | Discharge: 2012-07-20 | Disposition: A | Discharge status: Home / Self Care | Payer: Medicare PPO | Source: Ambulatory Visit | Attending: Cardiothoracic Surgery | Admitting: Cardiothoracic Surgery

## 2012-07-20 VITALS — BP 142/88 | HR 118 | Temp 99.3°F | Resp 20 | Ht 62.0 in | Wt 178.4 lb

## 2012-07-20 DIAGNOSIS — J45909 Unspecified asthma, uncomplicated: Secondary | ICD-10-CM | POA: Insufficient documentation

## 2012-07-20 DIAGNOSIS — Z01812 Encounter for preprocedural laboratory examination: Secondary | ICD-10-CM | POA: Insufficient documentation

## 2012-07-20 DIAGNOSIS — R222 Localized swelling, mass and lump, trunk: Secondary | ICD-10-CM | POA: Insufficient documentation

## 2012-07-20 DIAGNOSIS — R05 Cough: Secondary | ICD-10-CM | POA: Insufficient documentation

## 2012-07-20 DIAGNOSIS — F17200 Nicotine dependence, unspecified, uncomplicated: Secondary | ICD-10-CM | POA: Insufficient documentation

## 2012-07-20 DIAGNOSIS — J9859 Other diseases of mediastinum, not elsewhere classified: Secondary | ICD-10-CM

## 2012-07-20 DIAGNOSIS — Z01810 Encounter for preprocedural cardiovascular examination: Secondary | ICD-10-CM | POA: Insufficient documentation

## 2012-07-20 DIAGNOSIS — R059 Cough, unspecified: Secondary | ICD-10-CM | POA: Insufficient documentation

## 2012-07-20 HISTORY — DX: Pneumonia, unspecified organism: J18.9

## 2012-07-20 HISTORY — DX: Major depressive disorder, single episode, unspecified: F32.9

## 2012-07-20 HISTORY — DX: Shortness of breath: R06.02

## 2012-07-20 HISTORY — DX: Anxiety disorder, unspecified: F41.9

## 2012-07-20 HISTORY — DX: Unspecified cataract: H26.9

## 2012-07-20 HISTORY — DX: Sjogren syndrome, unspecified: M35.00

## 2012-07-20 HISTORY — DX: Gastro-esophageal reflux disease without esophagitis: K21.9

## 2012-07-20 HISTORY — DX: Depression, unspecified: F32.A

## 2012-07-20 HISTORY — DX: Unspecified macular degeneration: H35.30

## 2012-07-20 LAB — COMPREHENSIVE METABOLIC PANEL
ALT: 17 U/L (ref 0–35)
AST: 17 U/L (ref 0–37)
Albumin: 3.8 g/dL (ref 3.5–5.2)
Alkaline Phosphatase: 86 U/L (ref 39–117)
BUN: 13 mg/dL (ref 6–23)
CO2: 21 mEq/L (ref 19–32)
Calcium: 9.9 mg/dL (ref 8.4–10.5)
Chloride: 103 mEq/L (ref 96–112)
Creatinine, Ser: 0.68 mg/dL (ref 0.50–1.10)
GFR calc Af Amer: >90 mL/min (ref >90)
GFR calc non Af Amer: 89 mL/min — ABNORMAL LOW (ref >90)
Glucose, Bld: 122 mg/dL — ABNORMAL HIGH (ref 70–99)
Potassium: 4.1 mEq/L (ref 3.5–5.1)
Sodium: 139 mEq/L (ref 135–145)
Total Bilirubin: 0.3 mg/dL (ref 0.3–1.2)
Total Protein: 7.5 g/dL (ref 6.0–8.3)

## 2012-07-20 LAB — URINE MICROSCOPIC-ADD ON

## 2012-07-20 LAB — CBC
HCT: 42 % (ref 36.0–46.0)
Hemoglobin: 14.2 g/dL (ref 12.0–15.0)
MCH: 30.3 pg (ref 26.0–34.0)
MCHC: 33.8 g/dL (ref 30.0–36.0)
MCV: 89.7 fL (ref 78.0–100.0)
Platelets: 365 10*3/uL (ref 150–400)
RBC: 4.68 MIL/uL (ref 3.87–5.11)
RDW: 13.8 % (ref 11.5–15.5)
WBC: 7.1 10*3/uL (ref 4.0–10.5)

## 2012-07-20 LAB — SURGICAL PCR SCREEN
MRSA, PCR: NEGATIVE
Staphylococcus aureus: NEGATIVE

## 2012-07-20 LAB — BLOOD GAS, ARTERIAL
Acid-base deficit: 2.5 mmol/L — ABNORMAL HIGH (ref 0.0–2.0)
Bicarbonate: 21.8 mEq/L (ref 20.0–24.0)
Drawn by: 206361
FIO2: 0.21 %
O2 Saturation: 97.4 %
Patient temperature: 98.6
TCO2: 23 mmol/L (ref 0–100)
pCO2 arterial: 38.1 mmHg (ref 35.0–45.0)
pH, Arterial: 7.376 (ref 7.350–7.450)
pO2, Arterial: 90.2 mmHg (ref 80.0–100.0)

## 2012-07-20 LAB — URINALYSIS, ROUTINE W REFLEX MICROSCOPIC
Bilirubin Urine: NEGATIVE
Glucose, UA: NEGATIVE mg/dL
Hgb urine dipstick: NEGATIVE
Ketones, ur: NEGATIVE mg/dL
Nitrite: NEGATIVE
Protein, ur: NEGATIVE mg/dL
Specific Gravity, Urine: 1.023 (ref 1.005–1.030)
Urobilinogen, UA: 0.2 mg/dL (ref 0.0–1.0)
pH: 5 (ref 5.0–8.0)

## 2012-07-20 LAB — PROTIME-INR
INR: 0.95 (ref 0.00–1.49)
Prothrombin Time: 12.6 seconds (ref 11.6–15.2)

## 2012-07-20 LAB — TYPE AND SCREEN
ABO/RH(D): O POS
Antibody Screen: NEGATIVE

## 2012-07-20 LAB — APTT: aPTT: 29 seconds (ref 24–37)

## 2012-07-20 LAB — ABO/RH: ABO/RH(D): O POS

## 2012-07-20 NOTE — Pre-Procedure Instructions (Signed)
[name] [name]  07/20/2012   Your procedure is scheduled on: Wednesday, April 2,2014  Report to [facility] Stay [facility] at 6:30 AM.  Call this number if you have problems the morning of surgery: [phone]   Remember:   Do not eat food or drink liquids after midnight.   Take these medicines the morning of surgery with A SIP OF WATER: sertraline (Zoloft), hydroxychloroquine (Plaquenil), finasteride (Propecia), azelastine-Fluticasone, Fluticasone Salmeterol (Advair) and if needed, diphenhydramine (Benadryl), Triamcinolone cream, and albuterol-ipratropium (Combivent) bring inhalers to the hospital with you.   Do not wear jewelry, make-up or nail polish.  Do not wear lotions, powders, or perfumes. You may wear deodorant.  Do not shave 48 hours prior to surgery.  Do not bring valuables to the hospital.  Contacts, dentures or bridgework may not be worn into surgery.  Leave suitcase in the car. After surgery it may be brought to your room.  For patients admitted to the hospital, checkout time is 11:00 AM the day of  discharge.   Patients discharged the day of surgery will not be allowed to drive home.  Name and phone number of your driver:   Special Instructions: Shower using CHG 2 nights before surgery and the night before surgery.  If you shower the day of surgery use CHG.  Use special wash - you have one bottle of CHG for all showers.  You should use approximately 1/3 of the bottle for each shower.   Please read over the following fact sheets that you were given: Pain Booklet, Coughing and Deep Breathing, Blood Transfusion Information and Surgical Site Infection Prevention

## 2012-07-21 HISTORY — PX: THYMECTOMY: SHX1063

## 2012-07-21 LAB — URINE CULTURE
Colony Count: NO GROWTH
Culture: NO GROWTH

## 2012-07-21 MED ORDER — DEXTROSE 5 % IV SOLN
1.5000 g | INTRAVENOUS | Status: AC
  Administered 2012-07-22: 1.5 g via INTRAVENOUS
  Filled 2012-07-21: qty 1.5

## 2012-07-21 NOTE — Progress Notes (Signed)
Anesthesia chart review: Patient is a 67 year old female scheduled for bronchoscopy, left VATS for resection of anterior mediastinal mass on 07/22/12.  History includes cutaneous lupus, Sjogren's disease, former smoker since 2013, obesity, macular degeneration, anxiety, depression, GERD, asthma, cataracts.  Pulmonologist is Dr. Jetty Duhamel.  PCP is Mady Gemma, PA-C.  EKG on 07/20/12 showed ST @ 104 bpm, possible inferior infarct (age undetermined).  Minor ST abnormality had improved since her EKG on 08/09/11.  No chest pain symptoms were documented at her PAT visit or at her TCTS visit from 07/09/12.  According to Dr. Dennie Maizes note she had exertional SOB, but none at rest.  Chest x-ray on 07/20/2012 showed no acute cardiopulmonary abnormality.  Chest CT on 07/09/12 showed: 1. Lobular soft tissue mass within the anterior mediastinum of 3.1 x 2.2 x 3.1 cm with internal calcifications. The primary considerations are that of teratoma or thymoma.  2. No mediastinal or hilar adenopathy.   Preoperative labs noted.  Urine culture showed no growth.  Clinical correlation on the day of surgery.  If she remains without acute CV symptoms then would anticipate she could proceed as planned.  Velna Ochs [facility] Short Stay Center/Anesthesiology Phone [phone] 07/21/2012 12:37 PM

## 2012-07-22 ENCOUNTER — Encounter (HOSPITAL_COMMUNITY): Payer: Self-pay | Admitting: *Deleted

## 2012-07-22 ENCOUNTER — Encounter (HOSPITAL_COMMUNITY)
Admission: RE | Disposition: A | Discharge status: Home / Self Care | Payer: Self-pay | Source: Ambulatory Visit | Attending: Cardiothoracic Surgery

## 2012-07-22 ENCOUNTER — Inpatient Hospital Stay (HOSPITAL_COMMUNITY): Payer: Medicare PPO | Admitting: Certified Registered"

## 2012-07-22 ENCOUNTER — Inpatient Hospital Stay (HOSPITAL_COMMUNITY): Payer: Medicare PPO

## 2012-07-22 ENCOUNTER — Inpatient Hospital Stay (HOSPITAL_COMMUNITY)
Admission: RE | Admit: 2012-07-22 | Discharge: 2012-07-25 | DRG: 164 | Disposition: A | Discharge status: Home / Self Care | Payer: Medicare PPO | Source: Ambulatory Visit | Attending: Cardiothoracic Surgery | Admitting: Cardiothoracic Surgery

## 2012-07-22 ENCOUNTER — Encounter (HOSPITAL_COMMUNITY): Payer: Self-pay | Admitting: Vascular Surgery

## 2012-07-22 DIAGNOSIS — F411 Generalized anxiety disorder: Secondary | ICD-10-CM | POA: Diagnosis present

## 2012-07-22 DIAGNOSIS — K219 Gastro-esophageal reflux disease without esophagitis: Secondary | ICD-10-CM | POA: Diagnosis present

## 2012-07-22 DIAGNOSIS — F329 Major depressive disorder, single episode, unspecified: Secondary | ICD-10-CM | POA: Diagnosis present

## 2012-07-22 DIAGNOSIS — J9819 Other pulmonary collapse: Secondary | ICD-10-CM | POA: Diagnosis present

## 2012-07-22 DIAGNOSIS — H3530 Unspecified macular degeneration: Secondary | ICD-10-CM | POA: Diagnosis present

## 2012-07-22 DIAGNOSIS — D150 Benign neoplasm of thymus: Secondary | ICD-10-CM | POA: Diagnosis present

## 2012-07-22 DIAGNOSIS — Z87891 Personal history of nicotine dependence: Secondary | ICD-10-CM

## 2012-07-22 DIAGNOSIS — M3500 Sicca syndrome, unspecified: Secondary | ICD-10-CM | POA: Diagnosis present

## 2012-07-22 DIAGNOSIS — J9859 Other diseases of mediastinum, not elsewhere classified: Secondary | ICD-10-CM

## 2012-07-22 DIAGNOSIS — J309 Allergic rhinitis, unspecified: Secondary | ICD-10-CM | POA: Diagnosis present

## 2012-07-22 DIAGNOSIS — R222 Localized swelling, mass and lump, trunk: Principal | ICD-10-CM | POA: Diagnosis present

## 2012-07-22 DIAGNOSIS — M329 Systemic lupus erythematosus, unspecified: Secondary | ICD-10-CM | POA: Diagnosis present

## 2012-07-22 DIAGNOSIS — F3289 Other specified depressive episodes: Secondary | ICD-10-CM | POA: Diagnosis present

## 2012-07-22 HISTORY — PX: VIDEO BRONCHOSCOPY: SHX5072

## 2012-07-22 HISTORY — PX: VIDEO ASSISTED THORACOSCOPY (VATS)/WEDGE RESECTION: SHX6174

## 2012-07-22 SURGERY — BRONCHOSCOPY, VIDEO-ASSISTED
Anesthesia: General | Site: Chest | Wound class: Clean Contaminated

## 2012-07-22 MED ORDER — DIPHENHYDRAMINE HCL 12.5 MG/5ML PO ELIX
12.5000 mg | ORAL_SOLUTION | Freq: Four times a day (QID) | ORAL | Status: DC | PRN
  Filled 2012-07-22: qty 5

## 2012-07-22 MED ORDER — TRIAMCINOLONE ACETONIDE 0.1 % EX CREA: 1.0000 "application " | TOPICAL_CREAM | Freq: Every day | CUTANEOUS | Status: DC | PRN

## 2012-07-22 MED ORDER — DEXTROSE 5 % IV SOLN
1.5000 g | Freq: Two times a day (BID) | INTRAVENOUS | Status: DC
  Filled 2012-07-22 (×2): qty 1.5

## 2012-07-22 MED ORDER — HYDROMORPHONE HCL PF 1 MG/ML IJ SOLN
INTRAMUSCULAR | Status: AC
  Filled 2012-07-22: qty 1

## 2012-07-22 MED ORDER — OXYCODONE-ACETAMINOPHEN 5-325 MG PO TABS
1.0000 | ORAL_TABLET | ORAL | Status: DC | PRN
  Administered 2012-07-24 (×2): 1 via ORAL
  Filled 2012-07-22 (×3): qty 1

## 2012-07-22 MED ORDER — SODIUM CHLORIDE 0.9 % IJ SOLN: 9.0000 mL | INTRAMUSCULAR | Status: DC | PRN

## 2012-07-22 MED ORDER — MOMETASONE FURO-FORMOTEROL FUM 100-5 MCG/ACT IN AERO
2.0000 | INHALATION_SPRAY | Freq: Two times a day (BID) | RESPIRATORY_TRACT | Status: DC
  Administered 2012-07-22 – 2012-07-23 (×2): 2 via RESPIRATORY_TRACT
  Filled 2012-07-22: qty 8.8

## 2012-07-22 MED ORDER — DEXTROSE 5 % IV SOLN
1.5000 g | Freq: Two times a day (BID) | INTRAVENOUS | Status: AC
  Administered 2012-07-22 – 2012-07-23 (×2): 1.5 g via INTRAVENOUS
  Filled 2012-07-22 (×3): qty 1.5

## 2012-07-22 MED ORDER — ROCURONIUM BROMIDE 100 MG/10ML IV SOLN
INTRAVENOUS | Status: DC | PRN
  Administered 2012-07-22: 50 mg via INTRAVENOUS
  Administered 2012-07-22: 20 mg via INTRAVENOUS

## 2012-07-22 MED ORDER — OXYCODONE HCL 5 MG PO TABS: 5.0000 mg | ORAL_TABLET | Freq: Once | ORAL | Status: DC | PRN

## 2012-07-22 MED ORDER — PHENYLEPHRINE HCL 10 MG/ML IJ SOLN
10.0000 mg | INTRAVENOUS | Status: DC | PRN
  Administered 2012-07-22: 20 ug/min via INTRAVENOUS

## 2012-07-22 MED ORDER — HYDROMORPHONE HCL PF 1 MG/ML IJ SOLN: 0.2500 mg | INTRAMUSCULAR | Status: DC | PRN

## 2012-07-22 MED ORDER — NALOXONE HCL 0.4 MG/ML IJ SOLN: 0.4000 mg | INTRAMUSCULAR | Status: DC | PRN

## 2012-07-22 MED ORDER — FENTANYL 10 MCG/ML IV SOLN
INTRAVENOUS | Status: DC
  Administered 2012-07-22: 140 ug via INTRAVENOUS
  Administered 2012-07-22: 200 ug via INTRAVENOUS
  Administered 2012-07-22 (×2): via INTRAVENOUS
  Administered 2012-07-23: 199.2 ug via INTRAVENOUS
  Administered 2012-07-23: 170 ug via INTRAVENOUS
  Administered 2012-07-23: 250 ug via INTRAVENOUS
  Administered 2012-07-23: 140 ug via INTRAVENOUS
  Administered 2012-07-23: 21:00:00 via INTRAVENOUS
  Administered 2012-07-23: 140 ug via INTRAVENOUS
  Administered 2012-07-23: 180 ug via INTRAVENOUS
  Administered 2012-07-24: 50 ug via INTRAVENOUS
  Administered 2012-07-24: 89.07 ug via INTRAVENOUS
  Filled 2012-07-22 (×5): qty 50

## 2012-07-22 MED ORDER — HYDROMORPHONE HCL PF 1 MG/ML IJ SOLN
0.2500 mg | INTRAMUSCULAR | Status: DC | PRN
  Administered 2012-07-22 (×3): 0.5 mg via INTRAVENOUS

## 2012-07-22 MED ORDER — FINASTERIDE 1 MG PO TABS: 1.0000 mg | ORAL_TABLET | Freq: Every day | ORAL | Status: DC

## 2012-07-22 MED ORDER — ONDANSETRON HCL 4 MG/2ML IJ SOLN
INTRAMUSCULAR | Status: DC | PRN
  Administered 2012-07-22: 4 mg via INTRAVENOUS

## 2012-07-22 MED ORDER — ONDANSETRON HCL 4 MG/2ML IJ SOLN: 4.0000 mg | Freq: Once | INTRAMUSCULAR | Status: DC | PRN

## 2012-07-22 MED ORDER — FENTANYL CITRATE 0.05 MG/ML IJ SOLN
INTRAMUSCULAR | Status: DC | PRN
  Administered 2012-07-22: 50 ug via INTRAVENOUS
  Administered 2012-07-22: 300 ug via INTRAVENOUS
  Administered 2012-07-22 (×2): 50 ug via INTRAVENOUS
  Administered 2012-07-22: 100 ug via INTRAVENOUS
  Administered 2012-07-22: 250 ug via INTRAVENOUS

## 2012-07-22 MED ORDER — LABETALOL HCL 5 MG/ML IV SOLN
INTRAVENOUS | Status: DC | PRN
  Administered 2012-07-22: 10 mg via INTRAVENOUS
  Administered 2012-07-22 (×2): 5 mg via INTRAVENOUS
  Administered 2012-07-22: 10 mg via INTRAVENOUS

## 2012-07-22 MED ORDER — POTASSIUM CHLORIDE 10 MEQ/50ML IV SOLN
10.0000 meq | Freq: Every day | INTRAVENOUS | Status: DC | PRN
  Administered 2012-07-23 (×3): 10 meq via INTRAVENOUS
  Filled 2012-07-22: qty 150

## 2012-07-22 MED ORDER — LACTATED RINGERS IV SOLN
INTRAVENOUS | Status: DC | PRN
  Administered 2012-07-22: 08:00:00 via INTRAVENOUS

## 2012-07-22 MED ORDER — MIDAZOLAM HCL 5 MG/5ML IJ SOLN
INTRAMUSCULAR | Status: DC | PRN
  Administered 2012-07-22 (×3): 1 mg via INTRAVENOUS

## 2012-07-22 MED ORDER — DIPHENHYDRAMINE HCL 50 MG/ML IJ SOLN: 12.5000 mg | Freq: Four times a day (QID) | INTRAMUSCULAR | Status: DC | PRN

## 2012-07-22 MED ORDER — NEOSTIGMINE METHYLSULFATE 1 MG/ML IJ SOLN
INTRAMUSCULAR | Status: DC | PRN
  Administered 2012-07-22: 5 mg via INTRAVENOUS

## 2012-07-22 MED ORDER — AZELASTINE-FLUTICASONE 137-50 MCG/ACT NA SUSP: 1.0000 | NASAL | Status: DC

## 2012-07-22 MED ORDER — ACETAMINOPHEN 10 MG/ML IV SOLN
1000.0000 mg | Freq: Four times a day (QID) | INTRAVENOUS | Status: AC
  Administered 2012-07-22 – 2012-07-23 (×3): 1000 mg via INTRAVENOUS
  Filled 2012-07-22 (×4): qty 100

## 2012-07-22 MED ORDER — AZELASTINE HCL 0.1 % NA SOLN: 1.0000 | Freq: Every day | NASAL | Status: DC | PRN

## 2012-07-22 MED ORDER — BISACODYL 5 MG PO TBEC
10.0000 mg | DELAYED_RELEASE_TABLET | Freq: Every day | ORAL | Status: DC
  Administered 2012-07-22 – 2012-07-24 (×2): 10 mg via ORAL
  Filled 2012-07-22 (×2): qty 2
  Filled 2012-07-22: qty 1

## 2012-07-22 MED ORDER — OXYCODONE HCL 5 MG/5ML PO SOLN: 5.0000 mg | Freq: Once | ORAL | Status: DC | PRN

## 2012-07-22 MED ORDER — SERTRALINE HCL 100 MG PO TABS
100.0000 mg | ORAL_TABLET | Freq: Every day | ORAL | Status: DC
  Administered 2012-07-23 – 2012-07-24 (×2): 100 mg via ORAL
  Filled 2012-07-22 (×4): qty 1

## 2012-07-22 MED ORDER — PROPOFOL 10 MG/ML IV BOLUS
INTRAVENOUS | Status: DC | PRN
  Administered 2012-07-22: 200 mg via INTRAVENOUS

## 2012-07-22 MED ORDER — GLYCOPYRROLATE 0.2 MG/ML IJ SOLN
INTRAMUSCULAR | Status: DC | PRN
  Administered 2012-07-22: 0.4 mg via INTRAVENOUS

## 2012-07-22 MED ORDER — HYDROXYCHLOROQUINE SULFATE 200 MG PO TABS
200.0000 mg | ORAL_TABLET | Freq: Two times a day (BID) | ORAL | Status: DC
  Administered 2012-07-23: 200 mg via ORAL
  Filled 2012-07-22 (×2): qty 1

## 2012-07-22 MED ORDER — TRAMADOL HCL 50 MG PO TABS
50.0000 mg | ORAL_TABLET | Freq: Four times a day (QID) | ORAL | Status: DC | PRN
  Administered 2012-07-22 – 2012-07-24 (×4): 100 mg via ORAL
  Filled 2012-07-22 (×4): qty 2

## 2012-07-22 MED ORDER — ALBUTEROL SULFATE (5 MG/ML) 0.5% IN NEBU
2.5000 mg | INHALATION_SOLUTION | RESPIRATORY_TRACT | Status: DC
  Administered 2012-07-22 – 2012-07-23 (×6): 2.5 mg via RESPIRATORY_TRACT
  Filled 2012-07-22 (×6): qty 0.5

## 2012-07-22 MED ORDER — OXYCODONE HCL 5 MG PO TABS: 5.0000 mg | ORAL_TABLET | ORAL | Status: AC | PRN

## 2012-07-22 MED ORDER — PHENYLEPHRINE HCL 10 MG/ML IJ SOLN: 10.0000 mg | INTRAVENOUS | Status: DC | PRN

## 2012-07-22 MED ORDER — ONDANSETRON HCL 4 MG/2ML IJ SOLN: 4.0000 mg | Freq: Four times a day (QID) | INTRAMUSCULAR | Status: DC | PRN

## 2012-07-22 MED ORDER — ONDANSETRON HCL 4 MG/2ML IJ SOLN
4.0000 mg | Freq: Four times a day (QID) | INTRAMUSCULAR | Status: DC | PRN
  Administered 2012-07-23: 4 mg via INTRAVENOUS
  Filled 2012-07-22: qty 2

## 2012-07-22 MED ORDER — FLUTICASONE PROPIONATE 50 MCG/ACT NA SUSP: 1.0000 | Freq: Every day | NASAL | Status: DC | PRN

## 2012-07-22 MED ORDER — OXYCODONE-ACETAMINOPHEN 5-325 MG PO TABS
1.0000 | ORAL_TABLET | ORAL | Status: DC | PRN
  Administered 2012-07-23 – 2012-07-24 (×2): 1 via ORAL
  Filled 2012-07-22 (×2): qty 1

## 2012-07-22 MED ORDER — MEPERIDINE HCL 25 MG/ML IJ SOLN: 6.2500 mg | INTRAMUSCULAR | Status: DC | PRN

## 2012-07-22 MED ORDER — DEXTROSE-NACL 5-0.9 % IV SOLN
INTRAVENOUS | Status: DC
  Administered 2012-07-22 – 2012-07-23 (×2): via INTRAVENOUS

## 2012-07-22 SURGICAL SUPPLY — 78 items
ADH SKN CLS APL DERMABOND .7 (GAUZE/BANDAGES/DRESSINGS) ×2
APL SRG 22X2 LUM MLBL SLNT (VASCULAR PRODUCTS)
APL SRG 7X2 LUM MLBL SLNT (VASCULAR PRODUCTS)
APPLICATOR TIP COSEAL (VASCULAR PRODUCTS) IMPLANT
APPLICATOR TIP EXT COSEAL (VASCULAR PRODUCTS) IMPLANT
BLADE SURG 11 STRL SS (BLADE) ×3 IMPLANT
BRUSH CYTOL CELLEBRITY 1.5X140 (MISCELLANEOUS) IMPLANT
CANISTER SUCTION 2500CC (MISCELLANEOUS) ×3 IMPLANT
CATH KIT ON Q 5IN SLV (PAIN MANAGEMENT) IMPLANT
CATH THORACIC 28FR (CATHETERS) IMPLANT
CATH THORACIC 36FR (CATHETERS) IMPLANT
CATH THORACIC 36FR RT ANG (CATHETERS) IMPLANT
CLIP TI MEDIUM 6 (CLIP) IMPLANT
CLIP TI WIDE RED SMALL 24 (CLIP) ×1 IMPLANT
CLOTH BEACON ORANGE TIMEOUT ST (SAFETY) ×3 IMPLANT
CONT SPEC 4OZ CLIKSEAL STRL BL (MISCELLANEOUS) ×6 IMPLANT
COVER SURGICAL LIGHT HANDLE (MISCELLANEOUS) ×1 IMPLANT
COVER TABLE BACK 60X90 (DRAPES) ×3 IMPLANT
DERMABOND ADVANCED (GAUZE/BANDAGES/DRESSINGS) ×1
DERMABOND ADVANCED .7 DNX12 (GAUZE/BANDAGES/DRESSINGS) IMPLANT
DRAPE INCISE IOBAN 66X45 STRL (DRAPES) ×1 IMPLANT
DRAPE LAPAROSCOPIC ABDOMINAL (DRAPES) ×3 IMPLANT
DRAPE WARM FLUID 44X44 (DRAPE) ×3 IMPLANT
DRILL BIT 7/64X5 (BIT) IMPLANT
ELECT BLADE 4.0 EZ CLEAN MEGAD (MISCELLANEOUS) ×6
ELECT REM PT RETURN 9FT ADLT (ELECTROSURGICAL) ×3
ELECTRODE BLDE 4.0 EZ CLN MEGD (MISCELLANEOUS) ×2 IMPLANT
ELECTRODE REM PT RTRN 9FT ADLT (ELECTROSURGICAL) ×2 IMPLANT
FORCEPS BIOP RJ4 1.8 (CUTTING FORCEPS) IMPLANT
GLOVE BIO SURGEON STRL SZ 6.5 (GLOVE) ×10 IMPLANT
GLOVE BIO SURGEON STRL SZ7 (GLOVE) ×3 IMPLANT
GOWN STRL NON-REIN LRG LVL3 (GOWN DISPOSABLE) ×16 IMPLANT
KIT BASIN OR (CUSTOM PROCEDURE TRAY) ×3 IMPLANT
KIT ROOM TURNOVER OR (KITS) ×3 IMPLANT
KIT SUCTION CATH 14FR (SUCTIONS) ×4 IMPLANT
MARKER SKIN DUAL TIP RULER LAB (MISCELLANEOUS) ×3 IMPLANT
NDL BIOPSY TRANSBRONCH 21G (NEEDLE) IMPLANT
NEEDLE BIOPSY TRANSBRONCH 21G (NEEDLE) IMPLANT
NS IRRIG 1000ML POUR BTL (IV SOLUTION) ×6 IMPLANT
OIL SILICONE PENTAX (PARTS (SERVICE/REPAIRS)) ×3 IMPLANT
PACK CHEST (CUSTOM PROCEDURE TRAY) ×3 IMPLANT
PAD ARMBOARD 7.5X6 YLW CONV (MISCELLANEOUS) ×6 IMPLANT
SCISSORS LAP 5X35 DISP (ENDOMECHANICALS) IMPLANT
SEALANT PROGEL (MISCELLANEOUS) IMPLANT
SEALANT SURG COSEAL 4ML (VASCULAR PRODUCTS) IMPLANT
SEALANT SURG COSEAL 8ML (VASCULAR PRODUCTS) IMPLANT
SOLUTION ANTI FOG 6CC (MISCELLANEOUS) ×3 IMPLANT
SPONGE GAUZE 4X4 12PLY (GAUZE/BANDAGES/DRESSINGS) ×3 IMPLANT
SUT PROLENE 3 0 SH DA (SUTURE) IMPLANT
SUT PROLENE 4 0 RB 1 (SUTURE)
SUT PROLENE 4-0 RB1 .5 CRCL 36 (SUTURE) IMPLANT
SUT SILK  1 MH (SUTURE) ×2
SUT SILK 1 MH (SUTURE) ×4 IMPLANT
SUT SILK 2 0SH CR/8 30 (SUTURE) IMPLANT
SUT SILK 3 0SH CR/8 30 (SUTURE) IMPLANT
SUT STEEL 1 (SUTURE) IMPLANT
SUT VIC AB 1 CTX 18 (SUTURE) ×1 IMPLANT
SUT VIC AB 1 CTX 36 (SUTURE) ×3
SUT VIC AB 1 CTX36XBRD ANBCTR (SUTURE) IMPLANT
SUT VIC AB 2-0 CTX 36 (SUTURE) ×1 IMPLANT
SUT VIC AB 2-0 UR6 27 (SUTURE) IMPLANT
SUT VIC AB 3-0 SH 8-18 (SUTURE) IMPLANT
SUT VIC AB 3-0 X1 27 (SUTURE) ×2 IMPLANT
SUT VICRYL 2 TP 1 (SUTURE) IMPLANT
SWAB COLLECTION DEVICE MRSA (MISCELLANEOUS) IMPLANT
SYR 20ML ECCENTRIC (SYRINGE) ×3 IMPLANT
SYSTEM SAHARA CHEST DRAIN ATS (WOUND CARE) ×3 IMPLANT
TAPE CLOTH SURG 4X10 WHT LF (GAUZE/BANDAGES/DRESSINGS) ×1 IMPLANT
TIP APPLICATOR SPRAY EXTEND 16 (VASCULAR PRODUCTS) IMPLANT
TOWEL OR 17X24 6PK STRL BLUE (TOWEL DISPOSABLE) ×6 IMPLANT
TOWEL OR 17X26 10 PK STRL BLUE (TOWEL DISPOSABLE) ×6 IMPLANT
TRAP SPECIMEN MUCOUS 40CC (MISCELLANEOUS) ×6 IMPLANT
TRAY FOLEY CATH 14FRSI W/METER (CATHETERS) ×3 IMPLANT
TUBE ANAEROBIC SPECIMEN COL (MISCELLANEOUS) IMPLANT
TUBE CONNECTING 12X1/4 (SUCTIONS) ×3 IMPLANT
TUBING BULK SUCTION (MISCELLANEOUS) ×1 IMPLANT
TUNNELER SHEATH ON-Q 11GX8 (MISCELLANEOUS) ×3 IMPLANT
WATER STERILE IRR 1000ML POUR (IV SOLUTION) ×6 IMPLANT

## 2012-07-22 NOTE — Brief Op Note (Addendum)
07/22/2012  10:46 AM  PATIENT:  [name] [name]  67 y.o. 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  PRE-OPERATIVE WEIGHT: 80kg  COMPLICATIONS: NO KNOWN

## 2012-07-22 NOTE — Addendum Note (Signed)
Addendum created 07/22/12 1339 by Coralee Rud, CRNA   Modules edited: Anesthesia Blocks and Procedures

## 2012-07-22 NOTE — Progress Notes (Signed)
Patient ID: [name] [name], female   DOB: Dec 23, 1945, 67 y.o.   MRN: [id number]                   [address]            [place] [address]          [phone]     Day of Surgery Procedure(s) (LRB): VIDEO BRONCHOSCOPY (N/A) VIDEO ASSISTED THORACOSCOPY (VATS)/WEDGE RESECTION (Left)  Total Length of Stay:  LOS: 0 days  BP 101/28  Pulse 91  Temp(Src) 98.4 F (36.9 C) (Oral)  Resp 15  SpO2 95%  .Intake/Output     04/01 0701 - 04/02 0700 04/02 0701 - 04/03 0700   P.O.  60   I.V.  1784   IV Piggyback  100   Total Intake   1944   Urine  200   Blood  75   Chest Tube  160   Total Output   435   Net   +1509          . dextrose 5 % and 0.9% NaCl 100 mL/hr at 07/22/12 1418     Lab Results  Component Value Date   WBC 7.1 07/20/2012   HGB 14.2 07/20/2012   HCT 42.0 07/20/2012   PLT 365 07/20/2012   GLUCOSE 122* 07/20/2012   ALT 17 07/20/2012   AST 17 07/20/2012   NA 139 07/20/2012   K 4.1 07/20/2012   CL 103 07/20/2012   CREATININE 0.68 07/20/2012   BUN 13 07/20/2012   CO2 21 07/20/2012   INR 0.95 07/20/2012   Stable in ICU Up to chair  Delight Ovens MD  Beeper [phone] Office [phone] 07/22/2012 5:44 PM

## 2012-07-22 NOTE — Preoperative (Signed)
Beta Blockers   Reason not to administer Beta Blockers:Not Applicable 

## 2012-07-22 NOTE — Anesthesia Procedure Notes (Addendum)
Procedure Name: Intubation Date/Time: 07/22/2012 8:52 AM Performed by: Coralee Rud Pre-anesthesia Checklist: Patient identified Patient Re-evaluated:Patient Re-evaluated prior to inductionOxygen Delivery Method: Circle system utilized Preoxygenation: Pre-oxygenation with 100% oxygen Intubation Type: IV induction Ventilation: Mask ventilation without difficulty Laryngoscope Size: Mac and 3 Grade View: Grade II Tube type: Oral Endobronchial tube: Double lumen EBT and Left and 35 Fr Number of attempts: 1 Airway Equipment and Method: Stylet and Bite block Placement Confirmation: ETT inserted through vocal cords under direct vision,  positive ETCO2 and breath sounds checked- equal and bilateral Secured at: 27 cm Tube secured with: Tape Dental Injury: Teeth and Oropharynx as per pre-operative assessment

## 2012-07-22 NOTE — H&P (Signed)
[address]       [name] [address]             [phone]       [name] [name] [facility] Medical Record #[id number] Date of Birth: [birth year]  Referring: No ref. provider found Primary Care: Lilia Argue  Chief Complaint:    Mediastinal mass unchanged since ct of 06/2011  History of Present Illness:    67 year old female who was admitted to hospital in march of 2013.     with history of tobacco use, allergic rhinitis, with new diagnosis of cutaneous lupus . Patient reports that she had a urologic procedure done approximately 5 months  ago and was given trimethoprim as antibiotic. Subsequently after that she noted a generalized rash. The rash has gotten worse and she has gone to her dermatologist Dr. Sharyn Lull and has had multiple skin biopsies done and was eventually referred to [facility]. She had biopsies and labs done which indicated that the patient has cutaneous lupus, labs and biopsy not available for review. These findings were discussed with on call dermatology at [facility]. Patient has been on multiple trials of prednisone without any improvement in her symptom.  She denies any recent fevers, chills, vomiting, chest pain, shortness of breath, abdominal pain, diarrhea, headaches or vision changes. She does report having nausea today which has resolved. Patient's sister noted that she is slightly out of breath but denied any shortness of breath on my interview with the patient.   On the admission in March ct of chest was done and comparied to recent ct and compared to the CT scan done today.   Current Activity/ Functional Status:  Patient is  independent with mobility/ambulation, transfers, ADL's, IADL's.   Past Medical History  Diagnosis Date  . Allergic rhinitis, cause unspecified   . Right ear pain   . Lupus (systemic lupus erythematosus)   . Anxiety   . Depression   . Shortness of  breath     Hx: of " for about a year since diagnosis of Lupus"  . Pneumonia     Hx: of as a child  . GERD (gastroesophageal reflux disease)   . Early cataracts, bilateral     Hx: of cataracts "not sure if both eyes"  . Macular degeneration     Hx: of  . Unspecified asthma     "allergy related asthma"  . H/O Sjogren's disease     Hx: of    Past Surgical History  Procedure Laterality Date  . No past surgeries      Family History  Problem Relation Age of Onset  . Heart disease Mother   . Heart disease Brother     History   Social History  . Marital Status: Divorced    Spouse Name: N/A    Number of Children: 0  . Years of Education: N/A   Occupational History  . Retired    Social History Main Topics  . Smoking status: Former Smoker    Types: Cigarettes    Quit date: 06/21/2011  . Smokeless tobacco: Never Used   Comment: Quit 4 weeks ago.  . Alcohol Use: Yes     Occasionally     History  Smoking status  . Former Smoker -- 0.50 packs/day for 30 years  . Types: Cigarettes  . Quit date:  06/21/2011  Smokeless tobacco  . Never Used    History  Alcohol Use  . Yes    Comment: Occasionally     No Known Allergies  Current Facility-Administered Medications  Medication Dose Route Frequency Provider Last Rate Last Dose  . cefUROXime (ZINACEF) 1.5 g in dextrose 5 % 50 mL IVPB  1.5 g Intravenous 60 min Pre-Op Delight Ovens, MD      . HYDROmorphone (DILAUDID) injection 0.25-0.5 mg  0.25-0.5 mg Intravenous Q5 min PRN Kerby Nora, MD      . HYDROmorphone (DILAUDID) injection 0.25-0.5 mg  0.25-0.5 mg Intravenous Q5 min PRN Aubery Lapping, MD      . meperidine (DEMEROL) injection 6.25-12.5 mg  6.25-12.5 mg Intravenous Q5 min PRN Aubery Lapping, MD      . ondansetron [facility]) injection 4 mg  4 mg Intravenous Once PRN Kerby Nora, MD      . ondansetron [facility]) injection 4 mg  4 mg Intravenous Once PRN Aubery Lapping, MD      . oxyCODONE (Oxy  IR/ROXICODONE) immediate release tablet 5 mg  5 mg Oral Once PRN Kerby Nora, MD       Or  . oxyCODONE (ROXICODONE) 5 MG/5ML solution 5 mg  5 mg Oral Once PRN Kerby Nora, MD      . oxyCODONE (Oxy IR/ROXICODONE) immediate release tablet 5 mg  5 mg Oral Once PRN Aubery Lapping, MD       Or  . oxyCODONE (ROXICODONE) 5 MG/5ML solution 5 mg  5 mg Oral Once PRN Aubery Lapping, MD       Facility-Administered Medications Ordered in Other Encounters  Medication Dose Route Frequency Provider Last Rate Last Dose  . fentaNYL (SUBLIMAZE) injection    PRN Coralee Rud, CRNA   50 mcg at 07/22/12 0746  . lactated ringers infusion    Continuous PRN Coralee Rud, CRNA      . lactated ringers infusion    Continuous PRN Coralee Rud, CRNA      . midazolam (VERSED) 5 MG/5ML injection    PRN Coralee Rud, CRNA   1 mg at 07/22/12 0800       Review of Systems:     Cardiac Review of Systems: Y or N  Chest Pain [  n  ]  Resting SOB [n   ] Exertional SOB  [ y ]  Myer Peer  ]   Pedal Edema [ n  ]    Palpitations [ n ] Syncope  [ n ]   Presyncope [n   ]  General Review of Systems: [Y] = yes [  ]=no Constitional: recent weight change [  ]; anorexia [  ]; fatigue [ n ]; nausea [  ]; night sweats [  ]; fever [ n ]; or chills [ n ];  Dental: poor dentition[n  ]; Last Dentist visit:  Eye : blurred vision [  ]; diplopia [   ]; vision changes [  ];  Amaurosis fugax[  ]; Resp: cough [  ];  wheezing[  ];  hemoptysis[  ]; shortness of breath[  ]; paroxysmal nocturnal dyspnea[  ]; dyspnea on exertion[  ]; or orthopnea[  ];  GI:  gallstones[  ], vomiting[  ];  dysphagia[  ]; melena[  ];  hematochezia [  ]; heartburn[  ];   Hx of  Colonoscopy[  ]; GU: kidney stones [  ]; hematuria[  ];   dysuria [  ];  nocturia[  ];  history of     obstruction [  ];             Skin: rash yes,  swelling[  ];, hair loss[  ];  peripheral edema[  ];  or itching[  ]; Musculosketetal: myalgias[  ];  joint swelling[  ];  joint erythema[  ];  joint pain[ y ];  back pain[  ];  Heme/Lymph: bruising[  ];  bleeding[  ];  anemia[  ];  Neuro: TIA[  ];  headaches[  ];  stroke[  ];  vertigo[  ];  seizures[  ];   paresthesias[  ];  difficulty walking[  ];  Psych:depression[  ]; anxiety[  ];  Endocrine: diabetes[  ];  thyroid dysfunction[  ];  Immunizations: Flu [?  ]; Pneumococcal[  ?];  Other:  Physical Exam: BP 145/89  Pulse 112  Temp(Src) 97.9 F (36.6 C) (Oral)  Resp 20  SpO2 93%  General appearance: alert, cooperative and appears stated age Neurologic: intact Heart: regular rate and rhythm, S1, S2 normal, no murmur, click, rub or gallop and normal apical impulse Lungs: clear to auscultation bilaterally and normal percussion bilaterally Abdomen: soft, non-tender; bowel sounds normal; no masses,  no organomegaly Extremities: extremities normal, atraumatic, no cyanosis or edema and Homans sign is negative, no sign of DVT   Diagnostic Studies & Laboratory data:     Recent Radiology Findings:  Dg Chest 2 View  07/20/2012  *RADIOLOGY REPORT*  Clinical Data: Mediastinal mass  CHEST - 2 VIEW  Comparison: August 09, 2011.  Findings: Cardiomediastinal silhouette appears normal.  No acute pulmonary disease is noted.  Bony thorax is intact.  IMPRESSION: No acute cardiopulmonary abnormality seen.   Original Report Authenticated By: Lupita Raider.,  M.D.        Technique: Multidetector CT imaging of the chest was performed  following the standard protocol during bolus administration of  intravenous contrast.  Contrast: 80mL OMNIPAQUE IOHEXOL 300 MG/ML SOLN  Comparison: August 09, 2011  Findings: Again noted within the upper mediastinum is a lobulated  soft tissue mass with central coarse calcification which measures  3.2 x 1.9 x 2.9 cm. The size and appearance are unchanged.  Both lungs  are clear with no evidence of interstitial disease,  pleural effusion, or infiltrate. No pericardial effusion is  present. The pulmonary arteries are normal in caliber, and there  are no filling defects within either pulmonary arterial system.  The thoracic aorta is normal in caliber and appearance. Mild  coronary artery and aortic atherosclerotic calcification is noted.  Axillary lymph nodes have decreased in size bilaterally and are  normal now.  The osseous structures and visualized upper abdomen are  unremarkable. There is a stable simple cyst in the right lobe of  the liver.  IMPRESSION:  Stable soft tissue mass in  the anterior-superior mediastinum.  Continued follow-up with CT in 6 - 8 months could be performed to  document stability, as the etiology is uncertain.    Recent Lab Findings: Lab Results  Component Value Date   WBC 7.1 07/20/2012   HGB 14.2 07/20/2012   HCT 42.0 07/20/2012   PLT 365 07/20/2012   GLUCOSE 122* 07/20/2012   ALT 17 07/20/2012   AST 17 07/20/2012   NA 139 07/20/2012   K 4.1 07/20/2012   CL 103 07/20/2012   CREATININE 0.68 07/20/2012   BUN 13 07/20/2012   CO2 21 07/20/2012   INR 0.95 07/20/2012      Assessment / Plan:      Stable mediastinal mass with calcification incidental noted on CT 07/2011 and unchanged 10/2011 in patient with newly diagnosed lupus, cutaneous. 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 I have discussed with the patient surgical options, She is agreeable to proceeding with surgical resection.    The goals risks and alternatives of the planned surgical procedure left VATS and resection mediastinal mass have been discussed with the patient in detail. The risks of the procedure including death,  infection, stroke, myocardial infarction, bleeding, blood transfusion have all been discussed specifically.  I have quoted [name] [name] a 2 % of perioperative mortality and a complication rate as high as 15 %. The patient's questions have been answered.[name] [name] is willing  to proceed with the planned procedure.   Delight Ovens MD  Beeper [phone] Office [phone] 07/22/2012 8:24 AM

## 2012-07-22 NOTE — Transfer of Care (Signed)
Immediate Anesthesia Transfer of Care Note  Patient: [name] [name]  Procedure(s) Performed: Procedure(s): VIDEO BRONCHOSCOPY (N/A) VIDEO ASSISTED THORACOSCOPY (VATS)/WEDGE RESECTION (Left)  Patient Location: PACU  Anesthesia Type:General  Level of Consciousness: awake, pateint uncooperative and confused  Airway & Oxygen Therapy: Patient connected to nasal cannula oxygen  Post-op Assessment: Report given to PACU RN, Post -op Vital signs reviewed and stable and Patient moving all extremities X 4  Post vital signs: Reviewed and stable  Complications: No apparent anesthesia complications

## 2012-07-22 NOTE — Anesthesia Postprocedure Evaluation (Signed)
Anesthesia Post Note  Patient: [name] [name]  Procedure(s) Performed: Procedure(s) (LRB): VIDEO BRONCHOSCOPY (N/A) VIDEO ASSISTED THORACOSCOPY (VATS)/WEDGE RESECTION (Left)  Anesthesia type: general  Patient location: PACU  Post pain: Pain level controlled  Post assessment: Patient's Cardiovascular Status Stable  Last Vitals:  Filed Vitals:   07/22/12 1230  BP: 133/55  Pulse:   Temp:   Resp:     Post vital signs: Reviewed and stable  Level of consciousness: sedated  Complications: No apparent anesthesia complications

## 2012-07-22 NOTE — Anesthesia Preprocedure Evaluation (Addendum)
Anesthesia Evaluation  Patient identified by MRN, date of birth, ID band Patient awake    Reviewed: Allergy & Precautions, H&P , NPO status , Patient's Chart, lab work & pertinent test results, reviewed documented beta blocker date and time   Airway Mallampati: II TM Distance: >3 FB Neck ROM: Full    Dental  (+) Teeth Intact and Dental Advisory Given   Pulmonary asthma ,  breath sounds clear to auscultation        Cardiovascular negative cardio ROS  Rhythm:Regular Rate:Normal     Neuro/Psych negative neurological ROS     GI/Hepatic Neg liver ROS, GERD-  Medicated and Controlled,  Endo/Other    Renal/GU negative Renal ROS     Musculoskeletal   Abdominal   Peds  Hematology   Anesthesia Other Findings Lupus (systemic lupus erythematosus  Reproductive/Obstetrics                        Anesthesia Physical Anesthesia Plan  ASA: III  Anesthesia Plan: General   Post-op Pain Management:    Induction: Intravenous  Airway Management Planned: Double Lumen EBT  Additional Equipment: Arterial line  Intra-op Plan:   Post-operative Plan: Extubation in OR  Informed Consent: I have reviewed the patients History and Physical, chart, labs and discussed the procedure including the risks, benefits and alternatives for the proposed anesthesia with the patient or authorized representative who has indicated his/her understanding and acceptance.   Dental advisory given  Plan Discussed with: CRNA and Surgeon  Anesthesia Plan Comments:       Anesthesia Quick Evaluation

## 2012-07-22 NOTE — OR Nursing (Signed)
Bronch started at 0904.

## 2012-07-23 ENCOUNTER — Inpatient Hospital Stay (HOSPITAL_COMMUNITY): Payer: Medicare PPO

## 2012-07-23 LAB — BASIC METABOLIC PANEL
Chloride: 108 mEq/L (ref 96–112)
Creatinine, Ser: 0.53 mg/dL (ref 0.50–1.10)
GFR calc Af Amer: >90 mL/min (ref >90)
Potassium: 3.3 mEq/L — ABNORMAL LOW (ref 3.5–5.1)
Sodium: 139 mEq/L (ref 135–145)

## 2012-07-23 LAB — POCT I-STAT 3, ART BLOOD GAS (G3+)
Acid-base deficit: 1 mmol/L (ref 0.0–2.0)
Bicarbonate: 24.8 mEq/L — ABNORMAL HIGH (ref 20.0–24.0)
O2 Saturation: 93 %
Patient temperature: 97.6
TCO2: 26 mmol/L (ref 0–100)
pCO2 arterial: 46.2 mmHg — ABNORMAL HIGH (ref 35.0–45.0)
pH, Arterial: 7.335 — ABNORMAL LOW (ref 7.350–7.450)
pO2, Arterial: 71 mmHg — ABNORMAL LOW (ref 80.0–100.0)

## 2012-07-23 LAB — CBC
MCV: 91.4 fL (ref 78.0–100.0)
Platelets: 247 10*3/uL (ref 150–400)
RDW: 13.9 % (ref 11.5–15.5)
WBC: 6.7 10*3/uL (ref 4.0–10.5)

## 2012-07-23 MED ORDER — HYDROXYCHLOROQUINE SULFATE 200 MG PO TABS
200.0000 mg | ORAL_TABLET | Freq: Two times a day (BID) | ORAL | Status: DC
  Administered 2012-07-24 – 2012-07-25 (×2): 200 mg via ORAL
  Filled 2012-07-23 (×6): qty 1

## 2012-07-23 NOTE — Progress Notes (Signed)
Pt admitted from 2300, oriented to unit and routines, pain management and safety discussed, pt verbalizes understanding - family at bedside

## 2012-07-23 NOTE — Progress Notes (Signed)
TCTS DAILY PROGRESS NOTE                   [address]            [place] [address]          [phone]      1 Day Post-Op Procedure(s) (LRB): VIDEO BRONCHOSCOPY (N/A) VIDEO ASSISTED THORACOSCOPY (VATS)/WEDGE RESECTION (Left)  Total Length of Stay:  LOS: 1 day   Subjective: OOB in chair.  Walked already this am.  No complaints.   Objective: Vital signs in last 24 hours: Temp:  [97.5 F (36.4 C)-98.4 F (36.9 C)] 97.5 F (36.4 C) (04/03 0731) Pulse Rate:  [76-91] 87 (04/03 0800) Cardiac Rhythm:  [-] Normal sinus rhythm (04/03 0800) Resp:  [9-21] 15 (04/03 0800) BP: (89-133)/(28-96) 110/55 mmHg (04/03 0800) SpO2:  [92 %-100 %] 97 % (04/03 0821) Arterial Line BP: (94-167)/(39-125) 128/125 mmHg (04/03 0700)  There were no vitals filed for this visit.  Weight change:    Hemodynamic parameters for last 24 hours:    Intake/Output from previous day: 04/02 0701 - 04/03 0700 In: 3953.9 [P.O.:280; I.V.:3223.9; IV Piggyback:450] Out: 1445 [Urine:1230; Blood:75; Chest Tube:140]  Intake/Output this shift: Total I/O In: 270 [P.O.:120; I.V.:100; IV Piggyback:50] Out: 80 [Urine:60; Chest Tube:20]  Current Meds: Scheduled Meds: . acetaminophen  1,000 mg Intravenous Q6H  . albuterol  2.5 mg Nebulization Q4H WA  . bisacodyl  10 mg Oral Daily  . cefUROXime (ZINACEF)  IV  1.5 g Intravenous Q12H  . fentaNYL   Intravenous Q4H  . hydroxychloroquine  200 mg Oral BID  . mometasone-formoterol  2 puff Inhalation BID  . sertraline  100 mg Oral Daily   Continuous Infusions: . dextrose 5 % and 0.9% NaCl 20 mL/hr at 07/23/12 0821   PRN Meds:.azelastine, diphenhydrAMINE, diphenhydrAMINE, fluticasone, naloxone, ondansetron (ZOFRAN) IV, ondansetron (ZOFRAN) IV, oxyCODONE, oxyCODONE-acetaminophen, oxyCODONE-acetaminophen, potassium chloride, sodium chloride, traMADol, triamcinolone cream  Physical Exam: General appearance: alert, cooperative and no distress Heart:  regular rate and rhythm Lungs: clear to auscultation bilaterally Wound: Dressed and dry Chest tube: No air leak  Lab Results: CBC: Recent Labs  07/20/12 1127 07/23/12 0336  WBC 7.1 6.7  HGB 14.2 10.4*  HCT 42.0 32.0*  PLT 365 247   BMET:  Recent Labs  07/20/12 1127 07/23/12 0336  NA 139 139  K 4.1 3.3*  CL 103 108  CO2 21 25  GLUCOSE 122* 122*  BUN 13 6  CREATININE 0.68 0.53  CALCIUM 9.9 7.9*    PT/INR:  Recent Labs  07/20/12 1127  LABPROT 12.6  INR 0.95   Radiology: Dg Chest Port 1 View  07/23/2012  *RADIOLOGY REPORT*  Clinical Data: Postop for VATS.  Removal of mediastinal mass.  PORTABLE CHEST - 1 VIEW  Comparison: 1 day prior  Findings: Right IJ central line terminates the mid to low SVC. Patient rotated right.  Mild right hemidiaphragm elevation.  Left- sided chest tube is unchanged position.  The side port is again outside the chest wall. No pneumothorax.  Borderline cardiomegaly.  Minimal left pleural thickening.  Mild volume loss/atelectasis at the left lung base.  Decreased subcutaneous emphysema about the left chest wall.  IMPRESSION: Left-sided chest tube unchanged in position; no pneumothorax.  Patchy areas of left-sided atelectasis are not significantly changed.   Original Report Authenticated By: Jeronimo Greaves, M.D.    Dg Chest Portable 1 View  07/22/2012  *RADIOLOGY REPORT*  Clinical Data: Postop chest surgery  PORTABLE CHEST - 1 VIEW  Comparison: 07/20/2012  Findings: Chest tube on the left.  The side hole is outside of the chest cavity on the left.  There is no pneumothorax.  There is gas in the left chest wall.  Right jugular central venous catheter tip in the SVC.  No pneumothorax.  Patchy density left lower lobe compatible with postop change.  IMPRESSION: Negative for pneumothorax.  Left chest tube side hole is outside of the chest cavity.   Original Report Authenticated By: Janeece Riggers, M.D.      Assessment/Plan: S/P Procedure(s) (LRB): VIDEO  BRONCHOSCOPY (N/A) VIDEO ASSISTED THORACOSCOPY (VATS)/WEDGE RESECTION (Left) Continue CTs to suction today. D/c A-line, Foley, mobilize, work on [employer]. Tx 3300.     [name] [name] H 07/23/2012 8:53 AM

## 2012-07-24 ENCOUNTER — Inpatient Hospital Stay (HOSPITAL_COMMUNITY): Payer: Medicare PPO

## 2012-07-24 ENCOUNTER — Encounter (HOSPITAL_COMMUNITY): Payer: Self-pay | Admitting: Cardiothoracic Surgery

## 2012-07-24 LAB — COMPREHENSIVE METABOLIC PANEL WITH GFR
ALT: 18 U/L (ref 0–35)
AST: 28 U/L (ref 0–37)
Albumin: 2.6 g/dL — ABNORMAL LOW (ref 3.5–5.2)
Alkaline Phosphatase: 87 U/L (ref 39–117)
BUN: 4 mg/dL — ABNORMAL LOW (ref 6–23)
CO2: 27 meq/L (ref 19–32)
Calcium: 8.5 mg/dL (ref 8.4–10.5)
Chloride: 101 meq/L (ref 96–112)
Creatinine, Ser: 0.56 mg/dL (ref 0.50–1.10)
GFR calc Af Amer: >90 mL/min (ref >90)
GFR calc non Af Amer: >90 mL/min (ref >90)
Glucose, Bld: 100 mg/dL — ABNORMAL HIGH (ref 70–99)
Potassium: 3.8 meq/L (ref 3.5–5.1)
Sodium: 135 meq/L (ref 135–145)
Total Bilirubin: 0.3 mg/dL (ref 0.3–1.2)
Total Protein: 5.7 g/dL — ABNORMAL LOW (ref 6.0–8.3)

## 2012-07-24 LAB — CBC
HCT: 31.6 % — ABNORMAL LOW (ref 36.0–46.0)
Hemoglobin: 10.5 g/dL — ABNORMAL LOW (ref 12.0–15.0)
MCH: 30.8 pg (ref 26.0–34.0)
MCHC: 33.2 g/dL (ref 30.0–36.0)
MCV: 92.7 fL (ref 78.0–100.0)
Platelets: 265 K/uL (ref 150–400)
RBC: 3.41 MIL/uL — ABNORMAL LOW (ref 3.87–5.11)
RDW: 14.1 % (ref 11.5–15.5)
WBC: 6.1 K/uL (ref 4.0–10.5)

## 2012-07-24 MED ORDER — IPRATROPIUM BROMIDE 0.02 % IN SOLN: 0.5000 mg | RESPIRATORY_TRACT | Status: DC | PRN

## 2012-07-24 MED ORDER — ALBUTEROL SULFATE (5 MG/ML) 0.5% IN NEBU: 2.5000 mg | INHALATION_SOLUTION | RESPIRATORY_TRACT | Status: DC | PRN

## 2012-07-24 NOTE — Clinical Documentation Improvement (Signed)
Abnormal Labs Clarification  THIS DOCUMENT IS NOT A PERMANENT PART OF THE MEDICAL RECORD  TO RESPOND TO THE THIS QUERY, FOLLOW THE INSTRUCTIONS BELOW:  1. If needed, update documentation for the patient's encounter via the notes activity.  2. Access this query again and click edit on the [employer].  3. After updating, or not, click F2 to complete all highlighted (required) fields concerning your review. Select "additional documentation in the medical record" OR "no additional documentation provided".  4. Click Sign note button.  5. The deficiency will fall out of your InBasket *Please let us know if you are not able to complete this workflow by phone or e-mail (listed below).  Please update your documentation within the medical record to reflect your response to this query.                                                                                   07/24/12  Dear [name] [name] [name] [name] [name] [name]  In a better effort to capture your patient's severity of illness, reflect appropriate length of stay and utilization of resources, a review of the medical record has revealed the following indicators.    Based on your clinical judgment, please clarify and document in a progress note and/or discharge summary the clinical condition associated with the following supporting information:  In responding to this query please exercise your independent judgment.  The fact that a query is asked, does not imply that any particular answer is desired or expected.  Abnormal findings (laboratory, x-ray, pathologic, and other diagnostic results) are not coded and reported unless the physician indicates their clinical significance.   The medical record reflects the following clinical findings, please clarify the diagnostic and/or clinical significance:      Noted on chest xray on 4/4 "Increased left lower lobe atelectasis" Albuterol HHN q 4 hours while awake  ordered 4/2 . If appropriate please  document secondary diagnosis. Thank you    Possible Clinical Conditions?                                          Expected Post op atelectasis   Abnormal finding             Other Condition              Cannot Clinically Determine    Treatment: Albuterol HHN q 4 hours while awake  Monitored: Chest xray daily x 3    Reviewed retro q responded to by dr. Wynetta Fines   Thank You,  [name] [name] [name] [name]  Clinical Documentation Specialist, BSN: Pager [phone]  Health Information Management [place]

## 2012-07-24 NOTE — Progress Notes (Signed)
Wasted 25ml fentanyl PCA; Witness Osvaldo Human, RN.

## 2012-07-24 NOTE — Op Note (Signed)
NAMECONSUELO, [name]:  [id number]  MEDICAL RECORD NO.:  [id number]  LOCATION:  2315                         FACILITY:  [facility]  PHYSICIAN:  Sheliah Plane, MD    DATE OF BIRTH:  May 31, 1945  DATE OF PROCEDURE:  07/22/2012 DATE OF DISCHARGE:                              OPERATIVE REPORT   PREOPERATIVE DIAGNOSIS:  Mediastinal mass.  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  SURGICAL PROCEDURE:  Bronchoscopy, left video-assisted thoracoscopy, and mini thoracotomy with resection of mediastinal mass.  SURGEON:  Sheliah Plane, MD  FIRST ASSISTANT:  Rowe Clack, PA  BRIEF HISTORY:  The patient is a 67 year old female with a history of cutaneous lupus who 8 months previously had been found incidentally to have a 3.5 to 4 cm mediastinal mass.  Followup scan showed its persistence, though not significantly enlarged.  Because of the unknown tissue diagnosis, surgical resection was recommended to the patient, who agreed and signed informed consent.  DESCRIPTION OF PROCEDURE:  The patient underwent general endotracheal anesthesia without incident.  The fiberoptic bronchoscope was passed through the double-lumen endotracheal tube.  With this, it became apparent that anesthesia placed the left-sided double-lumen tube down the right side.  Tube was withdrawn, placed in the correct position with good lateralization of the lung.  The scope was then removed.  The patient was turned in lateral decubitus position with the left side up, approximately 45 degrees.  A small port incision was made at approximately fourth intercostal space anterior axillary line.  The left lung was deflated, and a videoscope was passed into the left chest. There were some pulmonary adhesions to the mediastinum blocking total view of the mass.  We then made a small dilatory incision in the 3rd intercostal space to the left of the sternum.   With the port and through the small incision, we were able to dissect the lung off the mediastinum.  These appeared to be very fibrinous adhesions and not related to any tumor growth.  The mass itself was easily identified and dissected free in an encapsulated form from mediastinal.  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  The entire mass was resected, and was well encapsulated.  With the operative field hemostatic, a 28 chest tube was left through the port incision.  The incision through which the tumor was removed, was closed with interrupted 0 Vicryl, running 2-0 Vicryl, and a 3-0 subcuticular stitch and Dermabond.  Blood loss was minimal.  The patient tolerated the procedure without obvious complication, was extubated in the operating room, and transferred to the recovery room for further postoperative care.     Sheliah Plane, MD     EG/MEDQ  D:  07/23/2012  T:  07/23/2012  Job:  [id number]

## 2012-07-24 NOTE — Progress Notes (Addendum)
                   [address]            [place] [address]          [phone]    2 Days Post-Op Procedure(s) (LRB): VIDEO BRONCHOSCOPY (N/A) VIDEO ASSISTED THORACOSCOPY (VATS)/WEDGE RESECTION (Left)  Subjective: Patient with complaints of nausea and "stomach rumbling" Passing flatus  Objective: Vital signs in last 24 hours: Temp:  [98.2 F (36.8 C)-99.6 F (37.6 C)] 98.4 F (36.9 C) (04/04 0400) Pulse Rate:  [80-109] 80 (04/04 0400) Cardiac Rhythm:  [-] Normal sinus rhythm (04/04 0400) Resp:  [13-25] 18 (04/04 0400) BP: (88-122)/(37-81) 103/65 mmHg (04/04 0400) SpO2:  [93 %-100 %] 97 % (04/04 0400) Weight:  [81.2 kg (179 lb 0.2 oz)] 81.2 kg (179 lb 0.2 oz) (04/03 1611)     Intake/Output from previous day: 04/03 0701 - 04/04 0700 In: 915 [P.O.:360; I.V.:505; IV Piggyback:50] Out: 225 [Urine:105; Emesis/NG output:100; Chest Tube:20]   Physical Exam:  Cardiovascular: Slightly tachy Pulmonary: Clear to auscultation on right; diminished left base; no rales, wheezes, or rhonchi. Abdomen: Soft, non tender, bowel sounds present. Extremities: Mild bilateral lower extremity edema. Wounds: Dressing is clean and dry.     Lab Results: CBC: Recent Labs  07/23/12 0336 07/24/12 0445  WBC 6.7 6.1  HGB 10.4* 10.5*  HCT 32.0* 31.6*  PLT 247 265   BMET:  Recent Labs  07/23/12 0336 07/24/12 0445  NA 139 135  K 3.3* 3.8  CL 108 101  CO2 25 27  GLUCOSE 122* 100*  BUN 6 4*  CREATININE 0.53 0.56  CALCIUM 7.9* 8.5    PT/INR: No results found for this basename: LABPROT, INR,  in the last 72 hours ABG:  INR: Will add last result for INR, ABG once components are confirmed Will add last 4 CBG results once components are confirmed  Assessment/Plan:  1. CV - SR/ST 2.  Pulmonary - Chest tube removed yesterday. Pathology results pending. 3.H and H stable at 10.5 and 31.6 4.Stop PCA (not using) 5.GI-nausea but no abdominal pain. Zofran PRN. May need  to stop Percocet-encourage Ultram. Montior 6. Possible discharge 1-2 days  ZIMMERMAN,DONIELLE MPA-C 07/24/2012,7:39 AM   Path sent out , so no report yet  I have seen and examined [name] [name] and agree with the above assessment  and plan.  Delight Ovens MD Beeper [phone] Office [phone] 07/24/2012 5:08 PM

## 2012-07-25 ENCOUNTER — Inpatient Hospital Stay (HOSPITAL_COMMUNITY): Payer: Medicare PPO

## 2012-07-25 MED ORDER — OXYCODONE-ACETAMINOPHEN 5-325 MG PO TABS: 1.0000 | ORAL_TABLET | ORAL | Status: DC | PRN

## 2012-07-25 NOTE — Progress Notes (Signed)
Patient discharged home via private vehicle.  Central line removed by charge nurse, patient laid flat for 35 minutes following removal.  Patient education provided on prescription pain medication and how to care for thoracotomy site.  Patient given time to ask questions.

## 2012-07-25 NOTE — Progress Notes (Signed)
TCTS DAILY PROGRESS NOTE                   [address]            [place] [address]          [phone]      3 Days Post-Op Procedure(s) (LRB): VIDEO BRONCHOSCOPY (N/A) VIDEO ASSISTED THORACOSCOPY (VATS)/WEDGE RESECTION (Left)  Total Length of Stay:  LOS: 3 days   Subjective: Feels well, anxious to go home  Objective: Vital signs in last 24 hours: Temp:  [97.9 F (36.6 C)-98.7 F (37.1 C)] 98.6 F (37 C) (04/05 0700) Pulse Rate:  [89-111] 89 (04/05 0745) Cardiac Rhythm:  [-] Normal sinus rhythm (04/05 0824) Resp:  [19-33] 33 (04/05 0745) BP: (94-125)/(37-94) 112/53 mmHg (04/05 0745) SpO2:  [89 %-97 %] 93 % (04/05 0745)  Filed Weights   07/23/12 1611  Weight: 179 lb 0.2 oz (81.2 kg)    Weight change:    Hemodynamic parameters for last 24 hours:    Intake/Output from previous day:    Intake/Output this shift: Total I/O In: 240 [P.O.:240] Out: -   Current Meds: Scheduled Meds: . bisacodyl  10 mg Oral Daily  . hydroxychloroquine  200 mg Oral BID WC  . mometasone-formoterol  2 puff Inhalation BID  . sertraline  100 mg Oral Daily   Continuous Infusions:  PRN Meds:.albuterol, azelastine, fluticasone, ipratropium, oxyCODONE-acetaminophen, potassium chloride, traMADol, triamcinolone cream  General appearance: alert, cooperative and no distress Heart: regular rate and rhythm Lungs: clear to auscultation bilaterally Abdomen: benign  Extremities: no edema Wound: incisions healing well  Lab Results: CBC: Recent Labs  07/23/12 0336 07/24/12 0445  WBC 6.7 6.1  HGB 10.4* 10.5*  HCT 32.0* 31.6*  PLT 247 265   BMET:  Recent Labs  07/23/12 0336 07/24/12 0445  NA 139 135  K 3.3* 3.8  CL 108 101  CO2 25 27  GLUCOSE 122* 100*  BUN 6 4*  CREATININE 0.53 0.56  CALCIUM 7.9* 8.5    PT/INR: No results found for this basename: LABPROT, INR,  in the last 72 hours Radiology: Dg Chest 2 View  07/25/2012  *RADIOLOGY REPORT*  Clinical  Data: Post left VATS.  CHEST - 2 VIEW  Comparison:   the previous day's study  Findings: Right IJ central line extends to the cavoatrial junction. There is persistent subcutaneous edema lateral to the thoracic cage on the left.  No pneumothorax.  Small left pleural effusion. Patchy atelectasis or infiltrate in the lingula persists.  Right lung clear.  IMPRESSION:  1.  Persistent lingular atelectasis or infiltrate with small effusion   Original Report Authenticated By: D. Andria Rhein, MD    Dg Chest Port 1 View  07/24/2012  *RADIOLOGY REPORT*  Clinical Data: Recent VATS for mediastinal mass.  Lupus.  Status post left chest tube removal.  PORTABLE CHEST - 1 VIEW  Comparison: 07/23/2012  Findings: Right jugular center venous catheter remains in appropriate position.  Left chest tube has been removed since previous study.  No pneumothorax identified.  Increased atelectasis is seen in the left lung base.  Right lung remains clear.  Heart size is normal.  IMPRESSION:  1.  No evidence of pneumothorax following left chest tube removal. 2.  Increased left lower lobe atelectasis.   Original Report Authenticated By: Myles Rosenthal, M.D.      Assessment/Plan: S/P Procedure(s) (LRB): VIDEO BRONCHOSCOPY (N/A) VIDEO ASSISTED THORACOSCOPY (VATS)/WEDGE RESECTION (Left)  1 appears quite stable,  will d/c home today   GOLD,WAYNE E 07/25/2012 10:51 AM

## 2012-07-25 NOTE — Discharge Summary (Signed)
[address]       [place] [address]             [phone]     AZARYA OCONNELL September 23, 1945 67 y.o. [id number]  07/22/2012   Delight Ovens, MD  mediastinal mass History of Present Illness:  67 year old female who was admitted to hospital in march of 2013.  with history of tobacco use, allergic rhinitis, with new diagnosis of cutaneous lupus . Patient reports that she had a urologic procedure done approximately 5 months ago and was given trimethoprim as antibiotic. Subsequently after that she noted a generalized rash. The rash has gotten worse and she has gone to her dermatologist Dr. Sharyn Lull and has had multiple skin biopsies done and was eventually [facility]. She had biopsies and labs done which indicated that the patient has cutaneous lupus, labs and biopsy not available for review. These findings were discussed with on call dermatology at [facility]. Patient has been on multiple trials of prednisone without any improvement in her symptom. She denies any recent fevers, chills, vomiting, chest pain, shortness of breath, abdominal pain, diarrhea, headaches or vision changes. She does report having nausea today which has resolved. Patient's sister noted that she is slightly out of breath but denied any shortness of breath on my interview with the patient.  On the admission in March ct of chest was done and comparied to recent ct and compared to the CT scan done today.The mediastinal masswas stable over 8-9 months but its exact etiology is unknown and for that reason recommendations were made for the patient to have video-assisted thoracoscopy and surgical resection of the mass which was felt most likely to be a thymoma. She was admitted this hospitalization for the procedure  Past Medical History   Diagnosis  Date   .  Allergic rhinitis, cause unspecified    .  Right ear pain    .  Lupus (systemic lupus erythematosus)     .  Anxiety    .  Depression    .  Shortness of breath      Hx: of " for about a year since diagnosis of Lupus"   .  Pneumonia      Hx: of as a child   .  GERD (gastroesophageal reflux disease)    .  Early cataracts, bilateral      Hx: of cataracts "not sure if both eyes"   .  Macular degeneration      Hx: of   .  Unspecified asthma      "allergy related asthma"   .  H/O Sjogren's disease      Hx: of    Past Surgical History   Procedure  Laterality  Date   .  No past surgeries      Family History   Problem  Relation  Age of Onset   .  Heart disease  Mother    .  Heart disease  Brother     History    Social History   .  Marital Status:  Divorced     Spouse Name:  N/A     Number of Children:  0   .  Years of Education:  N/A    Occupational History   .  Retired     Social History Main Topics   .  Smoking status:  Former Smoker     Types:  Cigarettes  Quit date:  06/21/2011   .  Smokeless tobacco:  Never Used     Comment: Quit 4 weeks ago.    .  Alcohol Use:  Yes      Occasionally    History   Smoking status   .  Former Smoker -- 0.50 packs/day for 30 years   .  Types:  Cigarettes   .  Quit date:  06/21/2011   Smokeless tobacco   .  Never Used    History   Alcohol Use   .  Yes     Comment: Occasionally    No Known Allergies    Hospital Course:  The patient was admitted to the hospital and taken to the operating room on 07/22/2012 and underwent Procedure(s): OPERATIVE REPORT  PREOPERATIVE DIAGNOSIS: Mediastinal mass.  POSTOPERATIVE DIAGNOSIS: Mediastinal mass plus probable thymoma, final  pathology pending.  SURGICAL PROCEDURE: Bronchoscopy, left video-assisted thoracoscopy, and  mini thoracotomy with resection of mediastinal mass.  SURGEON: Sheliah Plane, MD  FIRST ASSISTANT: Rowe Clack, PA The patient  tolerated the procedure without obvious complication, was extubated in  the operating room, and transferred to the recovery room for  further  postoperative care.  Postoperative hospital course: Overall the patient has progressed quite quickly. All routine lines, monitors and drainage devices were discontinued in the standard fashion. She is advanced well in regards to postoperative rehabilitation with early ambulation.she is done well with pulmonary toilet and oxygen was weaned without difficulty. Pain was well controlled and the PCA pump was discontinued and she transition without difficulty to oral analgesics. She did have some mild discomfort associated with nausea but this responded to routine measures. Incisions were felt to be healing without evidence of infection.Pathology is pending at has been sent to outside facility for further evaluation.she is felt to be quite stable for discharge on 07/25/2012.       Recent Labs  07/23/12 0336 07/24/12 0445  NA 139 135  K 3.3* 3.8  CL 108 101  CO2 25 27  GLUCOSE 122* 100*  BUN 6 4*  CALCIUM 7.9* 8.5    Recent Labs  07/23/12 0336 07/24/12 0445  WBC 6.7 6.1  HGB 10.4* 10.5*  HCT 32.0* 31.6*  PLT 247 265   No results found for this basename: INR,  in the last 72 hours   Discharge Instructions:  The patient is discharged to home with extensive instructions on wound care and progressive ambulation.  They are instructed not to drive or perform any heavy lifting until returning to see the physician in his office.  Discharge Diagnosis:  mediastinal mass  Secondary Diagnosis: Patient Active Problem List  Diagnosis  . EAR PAIN, RIGHT  . UPPER RESPIRATORY INFECTION  . Asthma, mild intermittent  . SHOULDER PAIN, LEFT  . Allergic rhinitis due to pollen  . Generalized maculopapular rash  . Tobacco abuse  . Mass of mediastinum  . Leukopenia  . Hyponatremia  . Tachycardia  . Lupus erythematosus  . Alopecia   Past Medical History  Diagnosis Date  . Allergic rhinitis, cause unspecified   . Right ear pain   . Lupus (systemic lupus erythematosus)   .  Anxiety   . Depression   . Shortness of breath     Hx: of " for about a year since diagnosis of Lupus"  . Pneumonia     Hx: of as a child  . GERD (gastroesophageal reflux disease)   . Early cataracts, bilateral     Hx:  of cataracts "not sure if both eyes"  . Macular degeneration     Hx: of  . Unspecified asthma     "allergy related asthma"  . H/O Sjogren's disease     Hx: of        Medication List    TAKE these medications       albuterol-ipratropium 18-103 MCG/ACT inhaler  Commonly known as:  COMBIVENT  Inhale 2 puffs into the lungs every 4 (four) hours as needed for wheezing.     Azelastine-Fluticasone 137-50 MCG/ACT Susp  Place 1 spray into the nose as directed.     diphenhydrAMINE 25 MG tablet  Commonly known as:  BENADRYL  Take 50 mg by mouth every 6 (six) hours as needed. For skin irritation     finasteride 1 MG tablet  Commonly known as:  PROPECIA  Take 1 mg by mouth daily.     FLAXSEED (LINSEED) PO  Take 1 capsule by mouth daily.     Fluticasone-Salmeterol 100-50 MCG/DOSE Aepb  Commonly known as:  ADVAIR  Inhale 1 puff into the lungs every 12 (twelve) hours. Rinse mouth     hydroxychloroquine 200 MG tablet  Commonly known as:  PLAQUENIL  Take 200 mg by mouth 2 (two) times daily.     oxyCODONE-acetaminophen 5-325 MG per tablet  Commonly known as:  PERCOCET/ROXICET  Take 1-2 tablets by mouth every 4 (four) hours as needed.     RED YEAST RICE PO  Take 1 tablet by mouth daily.     sertraline 100 MG tablet  Commonly known as:  ZOLOFT  Take 100 mg by mouth daily.     triamcinolone cream 0.1 %  Commonly known as:  KENALOG  Apply 1 application topically daily as needed. For skin relief     Vitamin D (Ergocalciferol) 50000 UNITS Caps  Commonly known as:  DRISDOL  Take 50,000 Units by mouth.     zolpidem 10 MG tablet  Commonly known as:  AMBIEN  Take 10 mg by mouth at bedtime as needed for sleep.       Follow-up Information   Follow up with  GERHARDT,EDWARD B, MD. (office will contact you about appointments)    Contact information:   [address] [place] [address] [phone]      Disposition: discharged home  Patient's condition is Good  Gershon Crane, PA-C 07/25/2012  11:02 AM

## 2012-07-28 ENCOUNTER — Other Ambulatory Visit: Payer: Self-pay | Admitting: *Deleted

## 2012-07-28 DIAGNOSIS — R222 Localized swelling, mass and lump, trunk: Secondary | ICD-10-CM

## 2012-08-03 ENCOUNTER — Ambulatory Visit
Admission: RE | Admit: 2012-08-03 | Discharge: 2012-08-03 | Disposition: A | Discharge status: Home / Self Care | Payer: Medicare PPO | Source: Ambulatory Visit | Attending: Cardiothoracic Surgery | Admitting: Cardiothoracic Surgery

## 2012-08-03 ENCOUNTER — Ambulatory Visit (INDEPENDENT_AMBULATORY_CARE_PROVIDER_SITE_OTHER): Payer: Self-pay | Admitting: *Deleted

## 2012-08-03 DIAGNOSIS — J9859 Other diseases of mediastinum, not elsewhere classified: Secondary | ICD-10-CM

## 2012-08-03 DIAGNOSIS — R222 Localized swelling, mass and lump, trunk: Secondary | ICD-10-CM

## 2012-08-03 DIAGNOSIS — Z09 Encounter for follow-up examination after completed treatment for conditions other than malignant neoplasm: Secondary | ICD-10-CM

## 2012-08-03 DIAGNOSIS — D381 Neoplasm of uncertain behavior of trachea, bronchus and lung: Secondary | ICD-10-CM

## 2012-08-03 DIAGNOSIS — Z4802 Encounter for removal of sutures: Secondary | ICD-10-CM

## 2012-08-03 NOTE — Progress Notes (Signed)
[name] [name] returns for suture removal from 2 previous chest tube sites.  These, as well as, her left min-thoracotomy incision are very well healed.  She died have a reaction to the hepafix tape resulting in redness and a rash.  She has not had any significant discomfort, bowels and appetite are normal.  She will return to see Dr. Tyrone Sage on 08/13/12.  She had a chest xray today.

## 2012-08-13 ENCOUNTER — Ambulatory Visit (INDEPENDENT_AMBULATORY_CARE_PROVIDER_SITE_OTHER): Payer: Self-pay | Admitting: Cardiothoracic Surgery

## 2012-08-13 ENCOUNTER — Encounter: Payer: Self-pay | Admitting: Cardiothoracic Surgery

## 2012-08-13 VITALS — BP 112/66 | HR 88 | Resp 20 | Ht 62.0 in | Wt 179.0 lb

## 2012-08-13 DIAGNOSIS — J9859 Other diseases of mediastinum, not elsewhere classified: Secondary | ICD-10-CM

## 2012-08-13 DIAGNOSIS — Z09 Encounter for follow-up examination after completed treatment for conditions other than malignant neoplasm: Secondary | ICD-10-CM

## 2012-08-13 DIAGNOSIS — C37 Malignant neoplasm of thymus: Secondary | ICD-10-CM

## 2012-08-13 DIAGNOSIS — D152 Benign neoplasm of mediastinum: Secondary | ICD-10-CM

## 2012-08-13 DIAGNOSIS — D150 Benign neoplasm of thymus: Secondary | ICD-10-CM

## 2012-08-13 DIAGNOSIS — R222 Localized swelling, mass and lump, trunk: Secondary | ICD-10-CM

## 2012-08-13 NOTE — Patient Instructions (Signed)
[address] Return for repeat ct of the chest one year

## 2012-08-13 NOTE — Progress Notes (Signed)
[address]            [place] [address]          [phone]       [name] [name] [facility] Medical Record #[id number] Date of Birth: 1946-03-13  [name], PA-C  Chief Complaint:   PostOp Follow Up Visit 07/22/2012  OPERATIVE REPORT  PREOPERATIVE DIAGNOSIS: Mediastinal mass.  POSTOPERATIVE DIAGNOSIS: Mediastinal mass plus probable thymoma, final  pathology pending.  SURGICAL PROCEDURE: Bronchoscopy, left video-assisted thoracoscopy, and  mini thoracotomy with resection of mediastinal mass.  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 - TUMOR SPANS APPROXIMATELY 3.4 CM IN GREATEST DIMENSION.  History of Present Illness:     Patient doing well following resection of her mediastinal mass and ultimately has been classified as a benign thymoma. She has had no symptoms of myasthenia, but does have a diagnosis of cutaneous lupus. She's made very good progress postoperatively now back to normal activities without any need of pain medication.     History  Smoking status  . Former Smoker -- 0.50 packs/day for 30 years  . Types: Cigarettes  . Quit date: 06/21/2011  Smokeless tobacco  . Never Used       No Known Allergies  Current Outpatient Prescriptions  Medication Sig Dispense Refill  . diphenhydrAMINE (BENADRYL) 25 MG tablet Take 50 mg by mouth every 6 (six) hours as needed. For skin irritation      . finasteride (PROPECIA) 1 MG tablet Take 1 mg by mouth daily.      Marland Kitchen FLAXSEED, LINSEED, PO Take 1 capsule by mouth daily.      . Fluticasone-Salmeterol (ADVAIR) 100-50 MCG/DOSE AEPB Inhale 1 puff into the lungs every 12 (twelve) hours. Rinse mouth  60 each  prn  . hydroxychloroquine (PLAQUENIL) 200 MG tablet Take 200 mg by mouth 2 (two) times daily.      Marland Kitchen oxyCODONE-acetaminophen (PERCOCET/ROXICET)  5-325 MG per tablet Take 1-2 tablets by mouth every 4 (four) hours as needed.  50 tablet  0  . Red Yeast Rice Extract (RED YEAST RICE PO) Take 1 tablet by mouth daily.      . sertraline (ZOLOFT) 100 MG tablet Take 100 mg by mouth daily.      Marland Kitchen triamcinolone cream (KENALOG) 0.1 % Apply 1 application topically daily as needed. For skin relief      . Vitamin D, Ergocalciferol, (DRISDOL) 50000 UNITS CAPS Take 50,000 Units by mouth.      . zolpidem (AMBIEN) 10 MG tablet Take 10 mg by mouth at bedtime as needed for sleep.       No current facility-administered medications for this visit.       Physical Exam: Resp 20  Ht 5\' 2"  (1.575 m)  Wt 179 lb (81.194 kg)  BMI 32.73 kg/m2  SpO2 95%  General appearance: alert and cooperative Neurologic: intact Heart: regular rate and rhythm, S1, S2 normal, no murmur, click, rub or gallop Lungs: clear to auscultation bilaterally and normal percussion bilaterally Abdomen: soft, non-tender; bowel sounds normal; no masses,  no organomegaly Extremities: extremities normal, atraumatic, no cyanosis or edema and Homans sign is negative, no sign of DVT Wound: The left parasternal incision is well-healed without evidence of infection as is the left chest tube site  Diagnostic Studies & Laboratory data:         Recent Radiology Findings: Dg Chest 2 View  08/03/2012  *RADIOLOGY REPORT*  Clinical Data: History of left VATS with resection of the anterior mediastinal mass  CHEST - 2 VIEW  Comparison: Chest x-ray of 07/25/2012  Findings: Aeration of the lungs has improved with resolution of basilar opacities.  The previous left pleural effusion has resolved.  Mediastinal contours are stable.  No pneumothorax is seen.  Heart size is stable.  No bony abnormality is seen.  IMPRESSION: Resolution of basilar atelectasis and prior left effusion.  No active lung disease.   Original Report Authenticated By: Dwyane Dee, M.D.     Janeece Riggers, M.D.     Recent Labs: Lab Results   Component Value Date   WBC 6.1 07/24/2012   HGB 10.5* 07/24/2012   HCT 31.6* 07/24/2012   PLT 265 07/24/2012   GLUCOSE 100* 07/24/2012   ALT 18 07/24/2012   AST 28 07/24/2012   NA 135 07/24/2012   K 3.8 07/24/2012   CL 101 07/24/2012   CREATININE 0.56 07/24/2012   BUN 4* 07/24/2012   CO2 27 07/24/2012   INR 0.95 07/20/2012      Assessment / Plan:     Patient doing well following resection of 3 cm thymoma/ benign at the time of surgical resection WHO type AB Plan to see the patient back in one year with a followup CT of the chest    [name] [name] 08/13/2012 10:32 AM

## 2013-01-19 ENCOUNTER — Encounter: Payer: Self-pay | Admitting: Internal Medicine

## 2013-01-19 ENCOUNTER — Ambulatory Visit (INDEPENDENT_AMBULATORY_CARE_PROVIDER_SITE_OTHER): Payer: Medicare PPO | Admitting: Internal Medicine

## 2013-01-19 VITALS — BP 122/80 | HR 112 | Ht 62.0 in | Wt 185.0 lb

## 2013-01-19 DIAGNOSIS — J4520 Mild intermittent asthma, uncomplicated: Secondary | ICD-10-CM

## 2013-01-19 DIAGNOSIS — J301 Allergic rhinitis due to pollen: Secondary | ICD-10-CM

## 2013-01-19 DIAGNOSIS — Z23 Encounter for immunization: Secondary | ICD-10-CM

## 2013-01-19 DIAGNOSIS — J45909 Unspecified asthma, uncomplicated: Secondary | ICD-10-CM

## 2013-01-19 MED ORDER — METHYLPREDNISOLONE ACETATE 80 MG/ML IJ SUSP
80.0000 mg | Freq: Once | INTRAMUSCULAR | Status: AC
  Administered 2013-01-19: 80 mg via INTRAMUSCULAR

## 2013-01-19 NOTE — Patient Instructions (Addendum)
Wear a dust mask when cleaning the old house  Depo 39  Ok to continue Advair  Instead of benadryl, you might want to try a non-sedating otc antihistamine like Claritin/ loratadine or Allegra/ fexofenadine

## 2013-01-19 NOTE — Assessment & Plan Note (Signed)
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 Likely exacerbation of allergic rhinitis.  Plan- dust mask, depo medrol, otc antihistamines. We can skin test again later if needed.

## 2013-01-19 NOTE — Assessment & Plan Note (Signed)
"  Choking cough likely related to exacerbation of allergic rhinitis. Watch for cough-equivalent asthma. Cautioned about potential reflux with her Sjogren's. Plan- Continue Advair, Flu vax

## 2013-01-19 NOTE — Progress Notes (Signed)
Subjective:    Patient ID: [name] [name], female    DOB: [birth year], 67 y.o.   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 No asthma flares. Admits only nasal congestion if outside at some times of year. Occasionally will supplement with a benadryl.  Continues allergy vaccine without problems. Continues Advair bid, but never needs the rescue inhaler.  11/29/11- 79 yoF former smoker followed for asthma, allergic rhinitis   PCP K.Arlyce Dice, PAC. 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  Also had a CT done  10/30/11.  Denies any problems with allergies and asthma. DC'd allergy vaccine 6 months ago when her lupus rash began. Asthma control good on Advair. Hospitalized April, 2013-[facility]dehydration.  CTw/cm 11/04/11 IMPRESSION:  Stable soft tissue mass in the anterior-superior mediastinum.  Continued follow-up with CT in 6 - 8 months could be performed to  document stability, as the etiology is uncertain.  Interval decrease in size of axillary adenopathy; normal now.  Original Report Authenticated By: Brandon Melnick, M.D.   03/30/12- 59 yoF former smoker followed for asthma, allergic rhinitis, complicated by lupus   PCP K.Arlyce Dice, PAC. [facility] FOLLOWS FOR: no longer on vaccine(about 1 year now); no flare ups Allergy vaccine stopped when lupus dx'd. Lupus on facial skin flares occasionally. Treats with benadryl lotion by dermatologist at [facility].  Allergic rhinitis well controlled except perennial drip.  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 Asthma control is good- needs Advair refill.  CT chest 11/01/11- reviewed IMPRESSION:  Stable soft tissue mass in the anterior-superior mediastinum.  Continued follow-up with CT in 6 - 8 months  could be performed to  document stability, as the etiology is uncertain.  Interval decrease in size of axillary adenopathy; normal now.  Original Report Authenticated By: Brandon Melnick, M.D.   01/19/13- 36 yoF former smoker followed for asthma, allergic rhinitis, complicated by lupus, Thymus enlarged   PCP K.Arlyce Dice[facility]. [facility] HPI Pt reports having dry cough, feels like shes choking at times x 1.5 month and worsening--  would like to inform of recent dx of cutaneous Lupus and Sjogren's. While on allergy vaccine in past, "choking cough" was gone. After quitting in 2013, cough gradually coming back. Cough started August, when she began cleaning out her mother's dusty old house. Using benadryl-sleepy. Sinus congestion, drip, choking cough-nonproductive, no wheeze. Has not filled nexium but not feeling heartburn.  CXR 08/03/12 IMPRESSION:  Resolution of basilar atelectasis and prior left effusion. No  active lung disease.  Original Report Authenticated By: Dwyane Dee, M.D.  ROS-see HPI Constitutional:   No-   weight loss, night sweats, fevers, chills, fatigue, lassitude. HEENT:   No-  headaches, difficulty swallowing, tooth/dental problems, sore throat,       No-  +sneezing,No- itching, ear ache,+ nasal congestion, +post nasal drip,  CV:  No-   chest pain, orthopnea, PND, swelling in lower extremities, anasarca, dizziness, palpitations Resp: No-   shortness of breath with exertion or at rest.              No-   productive cough,  No- non-productive cough,  No-  coughing up of blood.              No-   change in color of  mucus.  No- wheezing.   Skin: + Hyperpigmented areas on calves. GI:  No-   heartburn, indigestion, abdominal pain, nausea, vomiting,  GU:  MS:  No-   joint pain or swelling.   Neuro- nothing unusual  Psych:  No- change in mood or affect. No depression or anxiety.  No memory loss.     Objective:   Physical Exam General- Alert,  Oriented, Affect-appropriate, Distress- none acute Skin- rash-none, lesions- none, excoriation- none Lymphadenopathy- none Head- atraumatic            Eyes- Gross vision intact, PERRLA, conjunctivae clear secretions            Ears- Hearing, canals normal            Nose- Clear, No- Septal dev, mucus, polyps, erosion, perforation             Throat- Mallampati III , mucosa clear , drainage- none, tonsils- atrophic Neck- flexible , trachea midline, no stridor , thyroid nl, carotid no bruit Chest - symmetrical excursion , unlabored           Heart/CV- RRR , no murmur , no gallop  , no rub, nl s1 s2                           - JVD- none , edema- none, stasis changes- none, varices- none           Lung- clear to P&A, wheeze- none, cough- none , dullness-none, rub- none           Chest wall-  Abd- Br/ Gen/ Rectal- Not done, not indicated Extrem- cyanosis- none, clubbing, none, atrophy- none, strength- nl Neuro- grossly intact to observation  Assessment & Plan:

## 2013-03-09 ENCOUNTER — Encounter: Payer: Self-pay | Admitting: Internal Medicine

## 2013-03-09 ENCOUNTER — Ambulatory Visit (INDEPENDENT_AMBULATORY_CARE_PROVIDER_SITE_OTHER): Payer: Medicare PPO | Admitting: Internal Medicine

## 2013-03-09 VITALS — BP 120/70 | HR 115 | Ht 62.0 in | Wt 183.8 lb

## 2013-03-09 DIAGNOSIS — Z23 Encounter for immunization: Secondary | ICD-10-CM

## 2013-03-09 DIAGNOSIS — J4520 Mild intermittent asthma, uncomplicated: Secondary | ICD-10-CM

## 2013-03-09 DIAGNOSIS — M329 Systemic lupus erythematosus, unspecified: Secondary | ICD-10-CM

## 2013-03-09 DIAGNOSIS — J301 Allergic rhinitis due to pollen: Secondary | ICD-10-CM

## 2013-03-09 DIAGNOSIS — J45909 Unspecified asthma, uncomplicated: Secondary | ICD-10-CM

## 2013-03-09 NOTE — Patient Instructions (Signed)
Order- schedule PFT  Dx Asthma, Lupus  Pneumonia vaccine conjugate 13  Please call as needed

## 2013-03-09 NOTE — Progress Notes (Signed)
Subjective:    Patient ID: [name] [name], female    DOB: 02/04/1946, 67 y.o.   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 No asthma flares. Admits only nasal congestion if outside at some times of year. Occasionally will supplement with a benadryl.  Continues allergy vaccine without problems. Continues Advair bid, but never needs the rescue inhaler.  11/29/11- 40 yoF former smoker followed for asthma, allergic rhinitis   PCP K.Arlyce Dice, PAC. 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  Also had a CT done  10/30/11.  Denies any problems with allergies and asthma. DC'd allergy vaccine 6 months ago when her lupus rash began. Asthma control good on Advair. Hospitalized April, 2013-[facility].  CTw/cm 11/04/11 IMPRESSION:  Stable soft tissue mass in the anterior-superior mediastinum.  Continued follow-up with CT in 6 - 8 months could be performed to  document stability, as the etiology is uncertain.  Interval decrease in size of axillary adenopathy; normal now.  Original Report Authenticated By: Brandon Melnick, M.D.   03/30/12- 54 yoF former smoker followed for asthma, allergic rhinitis, complicated by lupus   PCP K.Arlyce Dice, PAC. [facility] FOLLOWS FOR: no longer on vaccine(about 1 year now); no flare ups Allergy vaccine stopped when lupus dx'd. Lupus on facial skin flares occasionally. Treats with benadryl lotion by dermatologist at [facility].  Allergic rhinitis well controlled except perennial drip.  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 Asthma control is good- needs Advair refill.  CT chest 11/01/11- reviewed IMPRESSION:  Stable soft tissue mass in the anterior-superior mediastinum.  Continued follow-up with CT in 6 - 8 months  could be performed to  document stability, as the etiology is uncertain.  Interval decrease in size of axillary adenopathy; normal now.  Original Report Authenticated By: Brandon Melnick, M.D.   01/19/13- 43 yoF former smoker followed for asthma, allergic rhinitis, complicated by lupus, Thymus enlarged   PCP K[facility] Pt reports having dry cough, feels like shes choking at times x 1.5 month and worsening--  would like to inform of recent dx of cutaneous Lupus and Sjogren's. While on allergy vaccine in past, "choking cough" was gone. After quitting in 2013, cough gradually coming back. Cough started August, when she began cleaning out her mother's dusty old house. Using benadryl-sleepy. Sinus congestion, drip, choking cough-nonproductive, no wheeze. Has not filled nexium but not feeling heartburn.  CXR 08/03/12 IMPRESSION:  Resolution of basilar atelectasis and prior left effusion. No  active lung disease.  Original Report Authenticated By: Dwyane Dee, M.D.  03/09/13- 62 yoF former smoker followed for COPD/ asthma, allergic rhinitis, complicated by Lupus/ Sjogrens, Thymus enlarged   PCP K.Arlyce Dice, PAC. [facility] FOLLOWS FOR: Patient states she feels a little better since being seen 01-19-13 Still occasional cough with scant clear mucus but no wheeze. Dyspnea on exertion hills and stairs. Had thymectomy in April 2014. Continues Advair and Claritin.  ROS-see HPI Constitutional:   No-   weight loss, night sweats, fevers, chills, fatigue, lassitude. HEENT:   No-  headaches, difficulty swallowing, tooth/dental problems, sore throat,       No-  +sneezing,No- itching, ear ache,+ nasal congestion, +post nasal drip,  CV:  No-   chest pain, orthopnea, PND, swelling in lower extremities, anasarca,  dizziness, palpitations Resp: No-   shortness of breath with exertion or at rest.              No-   productive cough,  No- non-productive  cough,  No-  coughing up of blood.              No-   change in color of mucus.  No- wheezing.   Skin: + Hyperpigmented areas on calves. GI:  No-   heartburn, indigestion, abdominal pain, nausea, vomiting,  GU:  MS:  No-   joint pain or swelling.   Neuro- nothing unusual  Psych:  No- change in mood or affect. No depression or anxiety.  No memory loss.     Objective:   Physical Exam General- Alert, Oriented, Affect-appropriate, Distress- none acute Skin- rash-none, lesions- none, excoriation- none Lymphadenopathy- none Head- atraumatic            Eyes- Gross vision intact, PERRLA, conjunctivae clear secretions            Ears- Hearing, canals normal            Nose- Clear, No- Septal dev, mucus, polyps, erosion, perforation             Throat- Mallampati III , mucosa clear , drainage- none, tonsils- atrophic Neck- flexible , trachea midline, no stridor , thyroid nl, carotid no bruit Chest - symmetrical excursion , unlabored           Heart/CV- RRR , no murmur , no gallop  , no rub, nl s1 s2                           - JVD- none , edema- none, stasis changes- none, varices- none           Lung- clear to P&A, wheeze- none, cough- none , dullness-none, rub- none           Chest wall-  Abd- Br/ Gen/ Rectal- Not done, not indicated Extrem- cyanosis- none, clubbing, none, atrophy- none, strength- nl Neuro- grossly intact to observation  Assessment & Plan:

## 2013-03-26 ENCOUNTER — Encounter: Payer: Self-pay | Admitting: Internal Medicine

## 2013-03-26 NOTE — Assessment & Plan Note (Signed)
Uncomplicated recently Plan- PFT, pneumonia conjugate 13 vaccine

## 2013-03-26 NOTE — Assessment & Plan Note (Signed)
Good control in the off season

## 2013-07-08 ENCOUNTER — Other Ambulatory Visit: Payer: Self-pay

## 2013-07-08 DIAGNOSIS — D150 Benign neoplasm of thymus: Secondary | ICD-10-CM

## 2013-08-05 ENCOUNTER — Ambulatory Visit: Payer: Medicare PPO | Admitting: Cardiothoracic Surgery

## 2013-08-05 ENCOUNTER — Other Ambulatory Visit: Payer: Medicare PPO

## 2013-08-12 ENCOUNTER — Ambulatory Visit: Payer: Medicare PPO | Admitting: Cardiothoracic Surgery

## 2013-08-12 ENCOUNTER — Other Ambulatory Visit: Payer: Medicare PPO

## 2013-08-20 ENCOUNTER — Telehealth: Payer: Self-pay | Admitting: Internal Medicine

## 2013-08-20 NOTE — Telephone Encounter (Signed)
Per CY cancel appointment 09/07/13 and reschedule for after her PFT or same day of her PFT. PFT not available until June, CY aware and ok with rescheduling for June. Transferred pt to [facility] to cancel appointment for May and schedule PFT and CY for June. Pt and CY ok with an nothing further is needed

## 2013-09-02 ENCOUNTER — Other Ambulatory Visit: Payer: Self-pay | Admitting: Cardiothoracic Surgery

## 2013-09-02 ENCOUNTER — Encounter: Payer: Self-pay | Admitting: Cardiothoracic Surgery

## 2013-09-02 ENCOUNTER — Ambulatory Visit (INDEPENDENT_AMBULATORY_CARE_PROVIDER_SITE_OTHER): Payer: Medicare PPO | Admitting: Cardiothoracic Surgery

## 2013-09-02 ENCOUNTER — Ambulatory Visit
Admission: RE | Admit: 2013-09-02 | Discharge: 2013-09-02 | Disposition: A | Discharge status: Home / Self Care | Payer: Medicare PPO | Source: Ambulatory Visit | Attending: Cardiothoracic Surgery | Admitting: Cardiothoracic Surgery

## 2013-09-02 VITALS — BP 138/78 | HR 92 | Resp 20 | Ht 62.0 in | Wt 180.0 lb

## 2013-09-02 DIAGNOSIS — D150 Benign neoplasm of thymus: Secondary | ICD-10-CM

## 2013-09-02 LAB — CREATININE, SERUM: Creat: 0.8 mg/dL (ref 0.50–1.10)

## 2013-09-02 LAB — BUN: BUN: 16 mg/dL (ref 6–23)

## 2013-09-02 MED ORDER — IOHEXOL 300 MG/ML  SOLN
75.0000 mL | Freq: Once | INTRAMUSCULAR | Status: AC | PRN
  Administered 2013-09-02: 75 mL via INTRAVENOUS

## 2013-09-02 NOTE — Progress Notes (Signed)
[name]       [name] [address]             [phone]         [name] [name] Sampson Medical Record #[id number] Date of Birth: Mar 23, 1946  [name], MD [place], [place]  Chief Complaint:   PostOp Follow Up Visit 07/22/2012  OPERATIVE REPORT  PREOPERATIVE DIAGNOSIS: Mediastinal mass.  POSTOPERATIVE DIAGNOSIS: Mediastinal mass plus probable thymoma, final  pathology pending.  SURGICAL PROCEDURE: Bronchoscopy, left video-assisted thoracoscopy, and  mini thoracotomy with resection of mediastinal mass.  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 - TUMOR SPANS APPROXIMATELY 3.4 CM IN GREATEST DIMENSION.  History of Present Illness:     Patient doing well following resection of her mediastinal mass and ultimately has been classified as a benign thymoma. She has had no symptoms of myasthenia, but does have a diagnosis of cutaneous lupus.   Past year since the patient had resection of her thymoma she's had no new medical problems develop but she is aware of.  History  Smoking status  . Former Smoker -- 0.50 packs/day for 30 years  . Types: Cigarettes  . Quit date: 06/21/2011  Smokeless tobacco  . Never Used       No Known Allergies  Current Outpatient Prescriptions  Medication Sig Dispense Refill  . diphenhydrAMINE (BENADRYL) 25 MG tablet Take 50 mg by mouth every 6 (six) hours as needed. For skin irritation      . esomeprazole (NEXIUM) 40 MG capsule Take 40 mg by mouth daily at 12 noon.      . finasteride (PROPECIA) 1 MG tablet Take 1 mg by mouth daily.      Marland Kitchen FLAXSEED, LINSEED, PO Take 1 capsule by mouth daily.      Marland Kitchen loratadine (CLARITIN) 10 MG tablet Take 10 mg by mouth daily.      . Red Yeast Rice Extract (RED YEAST RICE PO) Take 1 tablet by mouth daily.      . sertraline (ZOLOFT) 100 MG tablet Take 100 mg by mouth  daily.      . Vitamin D, Ergocalciferol, (DRISDOL) 50000 UNITS CAPS Take 50,000 Units by mouth.       No current facility-administered medications for this visit.       Physical Exam: BP 138/78  Pulse 92  Resp 20  Ht 5\' 2"  (1.575 m)  Wt 180 lb (81.647 kg)  BMI 32.91 kg/m2  SpO2 97%  General appearance: alert and cooperative Neurologic: intact Heart: regular rate and rhythm, S1, S2 normal, no murmur, click, rub or gallop Lungs: clear to auscultation bilaterally and normal percussion bilaterally Abdomen: soft, non-tender; bowel sounds normal; no masses,  no organomegaly Extremities: extremities normal, atraumatic, no cyanosis or edema and Homans sign is negative, no sign of DVT Wound: The left parasternal incision is well-healed without evidence of infection as is the left chest tube site,  there is some slight thickening of the lateral portion of the scar but the incision is well-healed on the left anterior chest Do not appreciate any cervical supraclavicular or axillary adenopathy, there are no palpable thyroid masses or nodules appreciated on exam.  Diagnostic Studies & Laboratory data:         Recent Radiology Findings: Ct Chest W Contrast  09/02/2013   CLINICAL DATA:  History of resection of thymoma in April of 2014,  followup  EXAM: CT CHEST WITH CONTRAST  TECHNIQUE: Multidetector CT imaging of the chest was performed during intravenous contrast administration.  CONTRAST:  3mL OMNIPAQUE IOHEXOL 300 MG/ML  SOLN  COMPARISON:  Chest x-ray of 08/03/2012 and CT chest of 07/09/2012  FINDINGS: Two small noncalcified nodules are noted in the right upper lobe, both of which appear stable and most consistent with a benign process. No focal infiltrate or pleural effusion is seen. The central airway is patent.  On soft tissue window images, the thyroid gland contains diffuse small low-attenuation nodules. If further assessment is warranted ultrasound of the thyroid could be performed. The  previously noted soft tissue mass within the anterior mediastinum has been resected in the interval. No residual abnormal anterior mediastinal soft tissue is seen. There is no evidence of mediastinal or hilar adenopathy. The thoracic aorta and pulmonary arteries opacify with no significant abnormality noted. Coronary artery calcifications noted primarily in the distribution of the left anterior descending artery. The portion of the upper abdomen that is visualized is unremarkable. No bony abnormality is seen.  IMPRESSION: 1. The previously noted anterior mediastinal soft tissue mass has been resected. No residual soft tissue or recurrence is evident. 2. No mediastinal or hilar adenopathy. 3. 2 small noncalcified nodules are stable within the right upper lobe. 4. Small thyroid nodules. Consider ultrasound of the thyroid to assess further if warranted clinically. 5. Calcifications within the left anterior descending coronary artery.   Electronically Signed   By: Ivar Drape M.D.   On: 09/02/2013 10:26  Dg Chest 2 View  08/03/2012  *RADIOLOGY REPORT*  Clinical Data: History of left VATS with resection of the anterior mediastinal mass  CHEST - 2 VIEW  Comparison: Chest x-ray of 07/25/2012  Findings: Aeration of the lungs has improved with resolution of basilar opacities.  The previous left pleural effusion has resolved.  Mediastinal contours are stable.  No pneumothorax is seen.  Heart size is stable.  No bony abnormality is seen.  IMPRESSION: Resolution of basilar atelectasis and prior left effusion.  No active lung disease.   Original Report Authenticated By: Ivar Drape, M.D.     Carl Best, M.D.     Recent Labs: Lab Results  Component Value Date   WBC 6.1 07/24/2012   HGB 10.5* 07/24/2012   HCT 31.6* 07/24/2012   PLT 265 07/24/2012   GLUCOSE 100* 07/24/2012   ALT 18 07/24/2012   AST 28 07/24/2012   NA 135 07/24/2012   K 3.8 07/24/2012   CL 101 07/24/2012   CREATININE 0.80 09/02/2013   BUN 16 09/02/2013   CO2 27  07/24/2012   INR 0.95 07/20/2012      Assessment / Plan:     Patient doing well following resection of 3 cm thymoma/ benign at the time of surgical resection WHO type AB- no evidence of recurrence only one year followup CT scan of the chest and no change in small benign appearing right upper lobe lung nodules   The CT scan of the chest this year does note small thyroid nodules not previously noted, ultrasound of the thyroid was suggested- the patient is referred back to primary care for thyroid evaluation.  I'll plan to see the patient back with a CT scan of the chest in one year, which will be 2 years after the resection of her thymoma.     Grace Isaac 09/02/2013 10:56 AM

## 2013-09-06 ENCOUNTER — Ambulatory Visit
Admission: RE | Admit: 2013-09-06 | Discharge: 2013-09-06 | Disposition: A | Discharge status: Home / Self Care | Payer: Medicare PPO | Source: Ambulatory Visit | Attending: Family Medicine | Admitting: Family Medicine

## 2013-09-06 ENCOUNTER — Other Ambulatory Visit: Payer: Self-pay | Admitting: Family Medicine

## 2013-09-06 DIAGNOSIS — E041 Nontoxic single thyroid nodule: Secondary | ICD-10-CM

## 2013-09-07 ENCOUNTER — Ambulatory Visit: Payer: Medicare PPO | Admitting: Internal Medicine

## 2013-09-21 ENCOUNTER — Encounter: Payer: Self-pay | Admitting: Internal Medicine

## 2013-09-30 ENCOUNTER — Other Ambulatory Visit: Payer: Medicare PPO

## 2013-09-30 ENCOUNTER — Encounter: Payer: Self-pay | Admitting: Internal Medicine

## 2013-09-30 ENCOUNTER — Other Ambulatory Visit (HOSPITAL_COMMUNITY): Payer: Self-pay | Admitting: Internal Medicine

## 2013-09-30 ENCOUNTER — Ambulatory Visit (INDEPENDENT_AMBULATORY_CARE_PROVIDER_SITE_OTHER): Payer: Medicare PPO | Admitting: Internal Medicine

## 2013-09-30 VITALS — BP 120/72 | HR 104 | Ht 62.0 in | Wt 185.0 lb

## 2013-09-30 DIAGNOSIS — R05 Cough: Secondary | ICD-10-CM

## 2013-09-30 DIAGNOSIS — R1310 Dysphagia, unspecified: Secondary | ICD-10-CM

## 2013-09-30 DIAGNOSIS — J301 Allergic rhinitis due to pollen: Secondary | ICD-10-CM

## 2013-09-30 DIAGNOSIS — R059 Cough, unspecified: Secondary | ICD-10-CM

## 2013-09-30 DIAGNOSIS — M329 Systemic lupus erythematosus, unspecified: Secondary | ICD-10-CM

## 2013-09-30 DIAGNOSIS — F17200 Nicotine dependence, unspecified, uncomplicated: Secondary | ICD-10-CM

## 2013-09-30 DIAGNOSIS — J309 Allergic rhinitis, unspecified: Secondary | ICD-10-CM

## 2013-09-30 DIAGNOSIS — Z720 Tobacco use: Secondary | ICD-10-CM

## 2013-09-30 DIAGNOSIS — J45909 Unspecified asthma, uncomplicated: Secondary | ICD-10-CM

## 2013-09-30 DIAGNOSIS — K219 Gastro-esophageal reflux disease without esophagitis: Secondary | ICD-10-CM

## 2013-09-30 LAB — PULMONARY FUNCTION TEST
DL/VA % pred: 94 %
DL/VA: 4.29 ml/min/mmHg/L
DLCO unc % pred: 76 %
DLCO unc: 16.56 ml/min/mmHg
FEF 25-75 POST: 3.38 L/s
FEF 25-75 PRE: 3.17 L/s
FEF2575-%CHANGE-POST: 6 %
FEF2575-%PRED-POST: 177 %
FEF2575-%Pred-Pre: 166 %
FEV1-%Change-Post: 2 %
FEV1-%Pred-Post: 96 %
FEV1-%Pred-Pre: 93 %
FEV1-PRE: 2.02 L
FEV1-Post: 2.08 L
FEV1FVC-%Change-Post: 2 %
FEV1FVC-%PRED-PRE: 116 %
FEV6-%CHANGE-POST: 0 %
FEV6-%PRED-PRE: 84 %
FEV6-%Pred-Post: 84 %
FEV6-POST: 2.28 L
FEV6-Pre: 2.28 L
FEV6FVC-%PRED-POST: 105 %
FEV6FVC-%Pred-Pre: 105 %
FVC-%Change-Post: 0 %
FVC-%PRED-POST: 80 %
FVC-%PRED-PRE: 80 %
FVC-PRE: 2.28 L
FVC-Post: 2.28 L
PRE FEV1/FVC RATIO: 89 %
Post FEV1/FVC ratio: 91 %
Post FEV6/FVC ratio: 100 %
Pre FEV6/FVC Ratio: 100 %
RV % pred: 85 %
RV: 1.74 L
TLC % pred: 93 %
TLC: 4.43 L

## 2013-09-30 MED ORDER — AZELASTINE-FLUTICASONE 137-50 MCG/ACT NA SUSP: 2.0000 | Freq: Every day | NASAL | Status: DC

## 2013-09-30 MED ORDER — FLUTICASONE-SALMETEROL 100-50 MCG/DOSE IN AEPB: 1.0000 | INHALATION_SPRAY | Freq: Two times a day (BID) | RESPIRATORY_TRACT | Status: DC

## 2013-09-30 MED ORDER — ALBUTEROL SULFATE HFA 108 (90 BASE) MCG/ACT IN AERS: 2.0000 | INHALATION_SPRAY | Freq: Four times a day (QID) | RESPIRATORY_TRACT | Status: AC | PRN

## 2013-09-30 MED ORDER — PROMETHAZINE-CODEINE 6.25-10 MG/5ML PO SYRP: 5.0000 mL | ORAL_SOLUTION | Freq: Four times a day (QID) | ORAL | Status: DC | PRN

## 2013-09-30 NOTE — Patient Instructions (Addendum)
Try to find ways to stay active- walk and move more to rebuild some stamina  Try to avoid frequent throat clearing, which irritates the back of the throat. Sugarless throat lozenges and sips of liquids can help.  Order- Speech Therapy assisted Modified Barium Swallow      Dx dysphagia with cough  Order lab- Allergy profile  Refill scripts for Advair and albuterol rescue inhaler  Sample Dymista nasal spray 1-2 puffs each nostril once daily at bedtime    See if this reduces postnasal drip  Script for cough syrup

## 2013-09-30 NOTE — Progress Notes (Signed)
Subjective:    Patient ID: [name] [name], female    DOB: 1945/05/25, 68 y.o.   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 No asthma flares. Admits only nasal congestion if outside at some times of year. Occasionally will supplement with a benadryl.  Continues allergy vaccine without problems. Continues Advair bid, but never needs the rescue inhaler.  11/29/11- 53 yoF former smoker followed for asthma, allergic rhinitis   PCP K.Deatra Ina, [place]. 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  Also had a CT done  10/30/11.  Denies any problems with allergies and asthma. DC'd allergy vaccine 6 months ago when her lupus rash began. Asthma control good on Advair. Hospitalized April, 2013-[facility]-dehydration.  CTw/cm 11/04/11 IMPRESSION:  Stable soft tissue mass in the anterior-superior mediastinum.  Continued follow-up with CT in 6 - 8 months could be performed to  document stability, as the etiology is uncertain.  Interval decrease in size of axillary adenopathy; normal now.  Original Report Authenticated By: Duayne Cal, M.D.   03/30/12- 37 yoF former smoker followed for asthma, allergic rhinitis, complicated by lupus   PCP K.Deatra Ina, [place]. [facility] [place] FOLLOWS FOR: no longer on vaccine(about 1 year now); no flare ups Allergy vaccine stopped when lupus dx'd. Lupus on facial skin flares occasionally. Treats with benadryl lotion by dermatologist at [facility].  Allergic rhinitis well controlled except perennial drip.  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 Asthma control is good- needs Advair refill.  CT chest 11/01/11- reviewed IMPRESSION:  Stable soft tissue mass in the anterior-superior mediastinum.  Continued follow-up with CT in 6 - 8 months  could be performed to  document stability, as the etiology is uncertain.  Interval decrease in size of axillary adenopathy; normal now.  Original Report Authenticated By: Duayne Cal, M.D.   01/19/13- 77 yoF former smoker followed for asthma, allergic rhinitis, complicated by lupus, Thymus enlarged   PCP K.Deatra Ina, [place]. [facility]see HPI Pt reports having dry cough, feels like shes choking at times x 1.5 month and worsening--  would like to inform of recent dx of cutaneous Lupus and Sjogren's. While on allergy vaccine in past, "choking cough" was gone. After quitting in 2013, cough gradually coming back. Cough started August, when she began cleaning out her mother's dusty old house. Using benadryl-sleepy. Sinus congestion, drip, choking cough-nonproductive, no wheeze. Has not filled nexium but not feeling heartburn.  CXR 08/03/12 IMPRESSION:  Resolution of basilar atelectasis and prior left effusion. No  active lung disease.  Original Report Authenticated By: Ivar Drape, M.D.  03/09/13- 23 yoF former smoker followed for COPD/ asthma, allergic rhinitis, complicated by Lupus/ Sjogrens, Thymus enlarged   PCP K.Deatra Ina, [place]. [facility] [place] FOLLOWS FOR: Patient states she feels a little better since being seen 01-19-13 Still occasional cough with scant clear mucus but no wheeze. Dyspnea on exertion hills and stairs. Had thymectomy in April 2014. Continues Advair and Claritin.  09/30/13- 73 yoF former smoker followed for COPD/ asthma, allergic rhinitis, complicated by Lupus/ Sjogrens, Thymus enlarged   PCP K.Deatra Ina, [place]. [facility] [place] Dr Servando Snare resected mediastinal mass-Thymoma. FOLLOWS FOR: Having SOB, wheezing, choking feelings in the throat; review PFT with patient today. PFT: Normal spirometry flows without response to bronchodilator, normal lung volumes, slight reduction of diffusion capacity  CT chest 09/02/13 IMPRESSION:  1. The previously  noted anterior mediastinal soft tissue mass has  been resected. No residual soft tissue or recurrence is evident.  2. No mediastinal or hilar adenopathy.  3. 2 small noncalcified nodules are stable within the right upper  lobe.  4. Small thyroid nodules. Consider ultrasound of the thyroid to  assess further if warranted clinically.  5. Calcifications within the left anterior descending coronary  artery.  Electronically Signed  By: Ivar Drape M.D.  On: 09/02/2013 10:26   ROS-see HPI Constitutional:   No-   weight loss, night sweats, fevers, chills, fatigue, lassitude. HEENT:   No-  headaches, difficulty swallowing, tooth/dental problems, sore throat,       No-  +sneezing,No- itching, ear ache,+ nasal congestion, +post nasal drip,  CV:  No-   chest pain, orthopnea, PND, swelling in lower extremities, anasarca, dizziness, palpitations Resp: No-   shortness of breath with exertion or at rest.              No-   productive cough,  No- non-productive cough,  No-  coughing up of blood.              No-   change in color of mucus.  + wheezing.   Skin: + Hyperpigmented areas on calves. GI:  No-   heartburn, indigestion, abdominal pain, nausea, vomiting,  GU:  MS:  No-   joint pain or swelling.   Neuro- nothing unusual  Psych:  No- change in mood or affect. No depression or anxiety.  No memory loss.     Objective:   Physical Exam General- Alert, Oriented, Affect-appropriate, Distress- none acute Skin- rash-none, lesions- none, excoriation- none Lymphadenopathy- none Head- atraumatic            Eyes- Gross vision intact, PERRLA, conjunctivae clear secretions            Ears- Hearing, canals normal            Nose- Clear, No- Septal dev, mucus, polyps, erosion, perforation             Throat- Mallampati III , mucosa clear , drainage- none, tonsils- atrophic Neck- flexible , trachea midline, no stridor , thyroid nl, carotid no bruit Chest - symmetrical excursion , unlabored            Heart/CV- RRR , no murmur , no gallop  , no rub, nl s1 s2                           - JVD- none , edema- none, stasis changes- none, varices- none           Lung- clear to P&A, wheeze- none, cough+ throat clearing cough , dullness-none, rub- none           Chest wall-  Abd- Br/ Gen/ Rectal- Not done, not indicated Extrem- cyanosis- none, clubbing, none, atrophy- none, strength- nl Neuro- grossly intact to observation  Assessment & Plan:

## 2013-09-30 NOTE — Progress Notes (Addendum)
PFT done today. Subjective:    Patient ID: [name] [name], female    DOB: 03-04-1946, 68 y.o.   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 No asthma flares. Admits only nasal congestion if outside at some times of year. Occasionally will supplement with a benadryl.  Continues allergy vaccine without problems. Continues Advair bid, but never needs the rescue inhaler.  11/29/11- 19 yoF former smoker followed for asthma, allergic rhinitis   PCP K.Deatra Ina, [place]. 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  Also had a CT done  10/30/11.  Denies any problems with allergies and asthma. DC'd allergy vaccine 6 months ago when her lupus rash began. Asthma control good on Advair. Hospitalized April, 2013-[facility]-dehydration.  CTw/cm 11/04/11 IMPRESSION:  Stable soft tissue mass in the anterior-superior mediastinum.  Continued follow-up with CT in 6 - 8 months could be performed to  document stability, as the etiology is uncertain.  Interval decrease in size of axillary adenopathy; normal now.  Original Report Authenticated By: Duayne Cal, M.D.   03/30/12- 10 yoF former smoker followed for asthma, allergic rhinitis, complicated by lupus   PCP K.Deatra Ina, [place]. [facility] [place] FOLLOWS FOR: no longer on vaccine(about 1 year now); no flare ups Allergy vaccine stopped when lupus dx'd. Lupus on facial skin flares occasionally. Treats with benadryl lotion by dermatologist at [facility].  Allergic rhinitis well controlled except perennial drip.  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 Asthma control is good- needs Advair refill.  CT chest 11/01/11- reviewed IMPRESSION:  Stable soft tissue mass in the anterior-superior mediastinum.  Continued follow-up with CT in  6 - 8 months could be performed to  document stability, as the etiology is uncertain.  Interval decrease in size of axillary adenopathy; normal now.  Original Report Authenticated By: Duayne Cal, M.D.   01/19/13- 7 yoF former smoker followed for asthma, allergic rhinitis, complicated by lupus, Thymus enlarged   PCP K.Deatra Ina, [place]. [facility] HPI Pt reports having dry cough, feels like shes choking at times x 1.5 month and worsening--  would like to inform of recent dx of cutaneous Lupus and Sjogren's. While on allergy vaccine in past, "choking cough" was gone. After quitting in 2013, cough gradually coming back. Cough started August, when she began cleaning out her mother's dusty old house. Using benadryl-sleepy. Sinus congestion, drip, choking cough-nonproductive, no wheeze. Has not filled nexium but not feeling heartburn.  CXR 08/03/12 IMPRESSION:  Resolution of basilar atelectasis and prior left effusion. No  active lung disease.  Original Report Authenticated By: Ivar Drape, M.D.  03/09/13- 75 yoF former smoker followed for COPD/ asthma, allergic rhinitis, complicated by Lupus/ Sjogrens, Thymus enlarged   PCP K.Deatra Ina, [place]. [facility] [place] FOLLOWS FOR: Patient states she feels a little better since being seen 01-19-13 Still occasional cough with scant clear mucus but no wheeze. Dyspnea on exertion hills and stairs. Had thymectomy in April 2014. Continues Advair and Claritin.  09/30/13- 42 yoF former smoker followed for COPD/ asthma, allergic rhinitis, complicated by Lupus/ Sjogrens, Thymus enlarged   PCP K.Deatra Ina, [place]. [facility] [place] Since last here had thymectomy-benign. Has cutaneous lupus and Sjogren's syndrome. Thyroid nodules. Complains of persistent choking sensation in throat which comes and goes, much postnasal drip not helped by Claritin, some reflux  on Nexium without heartburn. Asks refills for Advair and pro air.  PFT:  09/30/2013- normal spirometry flows with little response to bronchodilator, normal lung volumes, diffusion slightly reduced. FVC 2.28/80%, FEV1 2.08/96%, FEV1/FVC 0.91, TLC 93%, DLCO 76%.  ROS-see HPI Constitutional:   No-   weight loss, night sweats, fevers, chills, fatigue, lassitude. HEENT:   No-  headaches, difficulty swallowing, tooth/dental problems, sore throat,       No-  +sneezing,No- itching, ear ache,nasal congestion, +post nasal drip,  CV:  No-   chest pain, orthopnea, PND, swelling in lower extremities, anasarca, dizziness, palpitations Resp: + shortness of breath with exertion or at rest.              No-   productive cough,  + non-productive cough,  No-  coughing up of blood.              No-   change in color of mucus.  + wheezing.   Skin: + Hyperpigmented areas on calves. GI:  No-   heartburn, indigestion, abdominal pain, nausea, vomiting,  GU:  MS:  No-   joint pain or swelling.   Neuro- nothing unusual  Psych:  No- change in mood or affect. No depression or anxiety.  No memory loss.     Objective:   Physical Exam General- Alert, Oriented, Affect-appropriate, Distress- none acute Skin- rash-none, lesions- none, excoriation- none Lymphadenopathy- none Head- atraumatic            Eyes- Gross vision intact, PERRLA, conjunctivae clear secretions            Ears- Hearing, canals normal            Nose- Clear, No- Septal dev, mucus, polyps, erosion, perforation             Throat- Mallampati III-IV , mucosa clear , drainage- none, tonsils- atrophic Neck- flexible , trachea midline, no stridor , thyroid nl, carotid no bruit Chest - symmetrical excursion , unlabored           Heart/CV- RRR , no murmur , no gallop  , no rub, nl s1 s2                           - JVD- none , edema- none, stasis changes- none, varices- none           Lung- clear to P&A, wheeze- none, cough-+throat clearing cough frequent ,                    dullness-none, rub- none           Chest wall-   Abd- Br/ Gen/ Rectal- Not done, not indicated Extrem- cyanosis- none, clubbing, none, atrophy- none, strength- nl Neuro- grossly intact to observation  Assessment & Plan:

## 2013-10-01 LAB — ALLERGY FULL PROFILE
Allergen,Goose feathers, e70: <0.1 kU/L
Alternaria Alternata: <0.1 kU/L
Bahia Grass: <0.1 kU/L
Bermuda Grass: <0.1 kU/L
Box Elder IgE: <0.1 kU/L
Cat Dander: <0.1 kU/L
Common Ragweed: <0.1 kU/L
Curvularia lunata: <0.1 kU/L
Dog Dander: <0.1 kU/L
Fescue: <0.1 kU/L
G005 Rye, Perennial: <0.1 kU/L
Goldenrod: <0.1 kU/L
Helminthosporium halodes: <0.1 kU/L
IgE (Immunoglobulin E), Serum: 14.7 IU/mL (ref 0.0–180.0)
Lamb's Quarters: <0.1 kU/L
Stemphylium Botryosum: <0.1 kU/L
Timothy Grass: <0.1 kU/L

## 2013-10-12 ENCOUNTER — Ambulatory Visit (HOSPITAL_COMMUNITY)
Admission: RE | Admit: 2013-10-12 | Discharge: 2013-10-12 | Disposition: A | Discharge status: Home / Self Care | Payer: Medicare PPO | Source: Ambulatory Visit | Attending: Internal Medicine | Admitting: Internal Medicine

## 2013-10-12 DIAGNOSIS — R1310 Dysphagia, unspecified: Secondary | ICD-10-CM | POA: Insufficient documentation

## 2013-10-12 DIAGNOSIS — R059 Cough, unspecified: Secondary | ICD-10-CM | POA: Insufficient documentation

## 2013-10-12 DIAGNOSIS — R05 Cough: Secondary | ICD-10-CM

## 2013-10-12 NOTE — Procedures (Signed)
Objective Swallowing Evaluation: Modified Barium Swallowing Study  Patient Details  Name: [name] [name] MRN: [id number] Date of Birth: 1945/12/30  Today's Date: 10/12/2013 Time: 1325-1350 SLP Time Calculation (min): 25 min  Past Medical History:  Past Medical History  Diagnosis Date  . Allergic rhinitis, cause unspecified   . Right ear pain   . Lupus (systemic lupus erythematosus)   . Anxiety   . Depression   . Shortness of breath     Hx: of " for about a year since diagnosis of Lupus"  . Pneumonia     Hx: of as a child  . GERD (gastroesophageal reflux disease)   . Early cataracts, bilateral     Hx: of cataracts "not sure if both eyes"  . Macular degeneration     Hx: of  . Unspecified asthma(493.90)     "allergy related asthma"  . H/O Sjogren's disease     Hx: of   Past Surgical History:  Past Surgical History  Procedure Laterality Date  . No past surgeries    . Video bronchoscopy N/A 07/22/2012    Procedure: VIDEO BRONCHOSCOPY;  Surgeon: Grace Isaac, MD;  Location: [facility] OR;  Service: Thoracic;  Laterality: N/A;  . Video assisted thoracoscopy (vats)/wedge resection Left 07/22/2012    Procedure: VIDEO ASSISTED THORACOSCOPY (VATS)/WEDGE RESECTION;  Surgeon: Grace Isaac, MD;  Location: [facility];  Service: Thoracic;  Laterality: Left;  . Thymectomy  07/2012   HPI:  68 yo female referred by Dr Annamaria Boots for MBS due to pt report of choking sensation- with and without intake.  Occasionally issue occurs with drinking liquids.  Pt has PMH + for h/o smoking, Sjogren's, allergic rhinitis, cutaneous lupus, soft tissue mass in anterior-superior mediastinum s/p resection, enlarged thymus s/p resection and reflux treatment.  Pt reports cough to be ongoing for the last 2 years with gradual onset.  She denies weight loss nor pulmonary infections.  She admits to large pill dysphagia.       Assessment / Plan / Recommendation Clinical Impression  Dysphagia Diagnosis: Within Functional  Limits Clinical impression: Pt with functional oropharyngeal swallow ability without aspiration/penetration of any consistency tested.  Swallow was timely and strong with only difficulty being pt transiting barium tablet.  Reflexively pt sensed difficulties and took more liquids to transit tablet into pharynx and then from vallecular space into esophagus.  Upon esophageal sweep x3, pt with appearance of mildly slow clearance of boluses with increased viscocitty distally, thin barium swallows faciliated clearance- radiologist not present to confirm finding.  Mandible ROM appeared decreased, ? source ? sjogren's.  Reflux Symptom Index administered to pt with her score being 28/45 indicating high likelihood of LPR.  As pt is on reflux medications (with improvement of heartburn but not cough per pt), provided pt with reflux precautions.  Unfortunately, pt did not demonstrate symptoms during MBS (afterward subtle cough noted x1 after swallowing water).  She did complain of sensing "coating" in throat at end of test when pharynx appeared clear - water swallows cleared sensation per pt.  Thanks for this referral.      Treatment Recommendation  No treatment recommended at this time    Diet Recommendation Regular;Thin liquid   Liquid Administration via: Cup;Straw Medication Administration: Whole meds with liquid (or with solids/pudding as needed, consume liquids before and after) Supervision: Patient able to self feed Compensations: Slow rate;Small sips/bites (drink liquids t/o meal) Postural Changes and/or Swallow Maneuvers: Seated upright 90 degrees;Upright 30-60 min after meal  Other  Recommendations Oral Care Recommendations: Oral care BID   Follow Up Recommendations  None      General Date of Onset: 10/12/13 HPI: 68 yo female referred by Dr Annamaria Boots for MBS due to pt report of choking sensation- with and without intake.  Occasionally issue occurs with drinking liquids.  Pt has PMH + for h/o smoking,  Sjogren's, allergic rhinitis, cutaneous lupus, soft tissue mass in anterior-superior mediastinum s/p resection, enlarged thymus s/p resection and reflux treatment.  Pt reports cough to be ongoing for the last 2 years with gradual onset.  She denies weight loss nor pulmonary infections.  She admits to large pill dysphagia.   Type of Study: Modified Barium Swallowing Study Reason for Referral: Objectively evaluate swallowing function Diet Prior to this Study: Regular;Thin liquids Temperature Spikes Noted: No Respiratory Status: Room air History of Recent Intubation: No Behavior/Cognition: Alert;Cooperative;Pleasant mood Oral Cavity - Dentition: Adequate natural dentition Oral Motor / Sensory Function: Within functional limits (decreased mandible ROM, ? if due to lupus) Self-Feeding Abilities: Able to feed self Patient Positioning: Upright in bed Baseline Vocal Quality: Clear Volitional Cough: Strong Volitional Swallow: Able to elicit Anatomy: Within functional limits Pharyngeal Secretions: Not observed secondary MBS    Reason for Referral Objectively evaluate swallowing function   Oral Phase Oral Preparation/Oral Phase Oral Phase: WFL Oral - Nectar Oral - Nectar Cup: Within functional limits Oral - Thin Oral - Thin Cup: Within functional limits Oral - Thin Straw: Within functional limits Oral - Solids Oral - Puree: Within functional limits Oral - Regular: Within functional limits Oral - Pill: Reduced posterior propulsion;Weak lingual manipulation;Lingual pumping;Lingual/palatal residue (pt required extra liquid bolus to orally transit tablet )   Pharyngeal Phase Pharyngeal Phase Pharyngeal Phase: Impaired Pharyngeal - Solids Pharyngeal - Pill: Pharyngeal residue - valleculae (pill lodged momentarily in pharynx, pt cleared independently with extra swallow)  Cervical Esophageal Phase    GO    Cervical Esophageal Phase Cervical Esophageal Phase: [facility]    Functional Assessment  Tool Used: mbs, clinical judgement Functional Limitations: Swallowing Swallow Current Status (M0349): At least 1 percent but less than 20 percent impaired, limited or restricted Swallow Goal Status [phone]): At least 1 percent but less than 20 percent impaired, limited or restricted Swallow Discharge Status [phone]): At least 1 percent but less than 20 percent impaired, limited or restricted    Luanna Salk, [place] [facility] SLP [phone]

## 2013-10-14 NOTE — Progress Notes (Signed)
Quick Note:  Called spoke with patient, advised of swallowing eval results as stated by CY. Pt verbalized her understanding and denied any questions. ______

## 2013-10-14 NOTE — Progress Notes (Signed)
Quick Note:  LMTCB ______ 

## 2013-11-22 ENCOUNTER — Other Ambulatory Visit: Payer: Self-pay | Admitting: Internal Medicine

## 2013-11-29 ENCOUNTER — Other Ambulatory Visit: Payer: Self-pay

## 2013-11-29 MED ORDER — PROMETHAZINE-CODEINE 6.25-10 MG/5ML PO SYRP: 5.0000 mL | ORAL_SOLUTION | Freq: Four times a day (QID) | ORAL | Status: DC | PRN

## 2013-12-05 DIAGNOSIS — K219 Gastro-esophageal reflux disease without esophagitis: Secondary | ICD-10-CM | POA: Insufficient documentation

## 2013-12-05 NOTE — Assessment & Plan Note (Deleted)
Plan-sample Dymista nasal spray, lab for allergy profile

## 2013-12-05 NOTE — Assessment & Plan Note (Signed)
Underlying mild intermittent asthma. Cough now is being sustained partly by reflux and possibly by some postnasal drip

## 2013-12-05 NOTE — Addendum Note (Signed)
Addended by: Baird Lyons D on: 12/05/2013 02:35 PM   Modules accepted: Level of Service

## 2013-12-05 NOTE — Assessment & Plan Note (Signed)
Plan-sample Dymista nasal spray, lab for allergy profile

## 2013-12-05 NOTE — Assessment & Plan Note (Signed)
Reflux precautions emphasized Plan-modified barium swallow

## 2013-12-05 NOTE — Patient Instructions (Signed)
Scheduled modified barium swallow, allergy profile and given sample Dymista nasal spray, educated on reflux precautions

## 2013-12-05 NOTE — Assessment & Plan Note (Signed)
Successfully maintaining abstinence

## 2014-02-24 ENCOUNTER — Encounter: Payer: Self-pay | Admitting: Internal Medicine

## 2014-04-01 ENCOUNTER — Ambulatory Visit: Payer: Medicare PPO | Admitting: Internal Medicine

## 2014-04-05 ENCOUNTER — Encounter: Payer: Self-pay | Admitting: Internal Medicine

## 2014-08-16 ENCOUNTER — Other Ambulatory Visit: Payer: Self-pay | Admitting: *Deleted

## 2014-08-16 DIAGNOSIS — D150 Benign neoplasm of thymus: Secondary | ICD-10-CM

## 2014-09-06 ENCOUNTER — Ambulatory Visit: Payer: Medicare PPO | Admitting: Cardiothoracic Surgery

## 2014-09-06 ENCOUNTER — Other Ambulatory Visit: Payer: Medicare PPO

## 2014-10-13 ENCOUNTER — Ambulatory Visit: Payer: Medicare PPO | Admitting: Cardiothoracic Surgery

## 2014-10-13 ENCOUNTER — Other Ambulatory Visit: Payer: Medicare PPO

## 2014-10-14 ENCOUNTER — Ambulatory Visit: Payer: Medicare PPO | Admitting: Cardiothoracic Surgery

## 2014-11-24 ENCOUNTER — Ambulatory Visit (INDEPENDENT_AMBULATORY_CARE_PROVIDER_SITE_OTHER): Payer: Medicare PPO | Admitting: Cardiothoracic Surgery

## 2014-11-24 ENCOUNTER — Ambulatory Visit
Admission: RE | Admit: 2014-11-24 | Discharge: 2014-11-24 | Disposition: A | Discharge status: Home / Self Care | Payer: Medicare PPO | Source: Ambulatory Visit | Attending: Cardiothoracic Surgery | Admitting: Cardiothoracic Surgery

## 2014-11-24 ENCOUNTER — Encounter: Payer: Self-pay | Admitting: Cardiothoracic Surgery

## 2014-11-24 VITALS — BP 179/108 | HR 119 | Resp 16 | Ht 62.0 in | Wt 172.0 lb

## 2014-11-24 DIAGNOSIS — D150 Benign neoplasm of thymus: Secondary | ICD-10-CM

## 2014-11-24 NOTE — Progress Notes (Signed)
[address]       [place],[name] [name]             [phone]         [name] [name] [place] Medical Record #[id number] Date of Birth: 07-08-1945  [name] Abu, PA-C  Chief Complaint:   PostOp Follow Up Visit 07/22/2012  OPERATIVE REPORT  PREOPERATIVE DIAGNOSIS: Mediastinal mass.  POSTOPERATIVE DIAGNOSIS: Mediastinal mass plus probable thymoma, final  pathology pending.  SURGICAL PROCEDURE: Bronchoscopy, left video-assisted thoracoscopy, and  mini thoracotomy with resection of mediastinal mass.  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 - TUMOR SPANS APPROXIMATELY 3.4 CM IN GREATEST DIMENSION.  History of Present Illness:     Patient doing well following resection of her mediastinal mass and ultimately has been classified as a benign thymoma 27 months ago. 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 Continued to be treated with Plaquenil.  The patient smoked a pack a day for more than 40 years she did quit 3 years ago when diagnosed with lupus.  History  Smoking status  . Former Smoker -- 1.00 packs/day for 30 years  . Types: Cigarettes  . Start date: 11/21/1975  . Quit date: 06/21/2011  Smokeless tobacco  . Never Used       No Known Allergies  Current Outpatient Prescriptions  Medication Sig Dispense Refill  . diphenhydrAMINE (BENADRYL) 25 MG tablet Take 50 mg by mouth every 6 (six) hours as needed. For skin irritation    . esomeprazole (NEXIUM) 40 MG capsule Take 40 mg by mouth daily at 12 noon.    . finasteride (PROPECIA) 1 MG tablet Take 1 mg by mouth daily.    Marland Kitchen FLAXSEED, LINSEED, PO Take 1 capsule by mouth daily.    . Fluticasone-Salmeterol (ADVAIR) 100-50 MCG/DOSE AEPB Inhale 1 puff into the lungs 2 (two) times daily. 60  each prn  . loratadine (CLARITIN) 10 MG tablet Take 10 mg by mouth daily.    . Red Yeast Rice Extract (RED YEAST RICE PO) Take 1 tablet by mouth daily.    . sertraline (ZOLOFT) 100 MG tablet Take 100 mg by mouth daily.    . Vitamin D, Ergocalciferol, (DRISDOL) 50000 UNITS CAPS Take 50,000 Units by mouth.    Marland Kitchen albuterol (PROAIR HFA) 108 (90 BASE) MCG/ACT inhaler Inhale 2 puffs into the lungs 4 (four) times daily as needed for shortness of breath. (Patient not taking: Reported on 11/24/2014) 1 Inhaler prn   No current facility-administered medications for this visit.       Physical Exam: BP 179/108 mmHg  Pulse 119  Resp 16  Ht 5\' 2"  (1.575 m)  Wt 172 lb (78.019 kg)  BMI 31.45 kg/m2  SpO2 97%  General appearance: alert and cooperative Neurologic: intact Heart: regular rate and rhythm, S1, S2 normal, no murmur, click, rub or gallop Lungs: clear to auscultation bilaterally and normal percussion bilaterally Abdomen: soft, non-tender; bowel sounds normal; no masses,  no organomegaly Extremities: extremities normal, atraumatic, no cyanosis or edema and Homans sign is negative, no sign of DVT Wound: The left parasternal incision is well-healed without evidence of infection as is the left chest tube site,  there is some slight thickening of the lateral portion of the scar but the incision is well-healed on the  left anterior chest Do not appreciate any cervical supraclavicular or axillary adenopathy, there are no palpable thyroid masses or nodules appreciated on exam.  Diagnostic Studies & Laboratory data:         Recent Radiology Findings: Ct Chest Wo Contrast  11/24/2014   CLINICAL DATA:  Status post thymoma resection.  EXAM: CT CHEST WITHOUT CONTRAST  TECHNIQUE: Multidetector CT imaging of the chest was performed following the standard protocol without IV contrast.  COMPARISON:  09/02/2013  FINDINGS: Mediastinum: The heart size appears normal. There is no pericardial effusion. No mediastinal  or hilar adenopathy identified. Aortic atherosclerosis identified. Calcification involving the LAD coronary artery noted. No evidence for residual or recurrence of anterior mediastinal mass. The trachea appears patent and is midline. Normal appearance of the esophagus.  Lungs/Pleura: No pleural effusion identified. Tiny nodule in the posterior medial right lower lobe measures 3 mm, image 40/series 4. This is similar to the previous exam. No new pulmonary nodule or mass identified.  Upper Abdomen: The gallbladder appears normal. No biliary dilatation. Normal appearance of the visualized pancreas and spleen. The adrenal glands both appear normal. Low-attenuation structure within segment 7 of the liver measures 7 mm, image number 55 of series 3. Unchanged from previous exam.  Musculoskeletal: No aggressive lytic or sclerotic bone lesions.  IMPRESSION: 1. Stable CT of the chest. No evidence for residual or recurrent tumor within the anterior mediastinum. 2. 3 mm right lower lobe lung nodule is unchanged from previous exam. Attention on followup imaging advise. 3. Aortic atherosclerosis.   Electronically Signed   By: Kerby Moors M.D.   On: 11/24/2014 11:38   Ct Chest W Contrast  09/02/2013   CLINICAL DATA:  History of resection of thymoma in April of 2014, followup  EXAM: CT CHEST WITH CONTRAST  TECHNIQUE: Multidetector CT imaging of the chest was performed during intravenous contrast administration.  CONTRAST:  81mL OMNIPAQUE IOHEXOL 300 MG/ML  SOLN  COMPARISON:  Chest x-ray of 08/03/2012 and CT chest of 07/09/2012  FINDINGS: Two small noncalcified nodules are noted in the right upper lobe, both of which appear stable and most consistent with a benign process. No focal infiltrate or pleural effusion is seen. The central airway is patent.  On soft tissue window images, the thyroid gland contains diffuse small low-attenuation nodules. If further assessment is warranted ultrasound of the thyroid could be performed.  The previously noted soft tissue mass within the anterior mediastinum has been resected in the interval. No residual abnormal anterior mediastinal soft tissue is seen. There is no evidence of mediastinal or hilar adenopathy. The thoracic aorta and pulmonary arteries opacify with no significant abnormality noted. Coronary artery calcifications noted primarily in the distribution of the left anterior descending artery. The portion of the upper abdomen that is visualized is unremarkable. No bony abnormality is seen.  IMPRESSION: 1. The previously noted anterior mediastinal soft tissue mass has been resected. No residual soft tissue or recurrence is evident. 2. No mediastinal or hilar adenopathy. 3. 2 small noncalcified nodules are stable within the right upper lobe. 4. Small thyroid nodules. Consider ultrasound of the thyroid to assess further if warranted clinically. 5. Calcifications within the left anterior descending coronary artery.   Electronically Signed   By: Ivar Drape M.D.   On: 09/02/2013 10:26  Dg Chest 2 View  08/03/2012  *RADIOLOGY REPORT*  Clinical Data: History of left VATS with resection of the anterior mediastinal mass  CHEST - 2 VIEW  Comparison: Chest x-ray of 07/25/2012  Findings: Aeration of the lungs has improved with resolution of basilar opacities.  The previous left pleural effusion has resolved.  Mediastinal contours are stable.  No pneumothorax is seen.  Heart size is stable.  No bony abnormality is seen.  IMPRESSION: Resolution of basilar atelectasis and prior left effusion.  No active lung disease.   Original Report Authenticated By: Ivar Drape, M.D.     Carl Best, M.D.     Recent Labs: Lab Results  Component Value Date   WBC 6.1 07/24/2012   HGB 10.5* 07/24/2012   HCT 31.6* 07/24/2012   PLT 265 07/24/2012   GLUCOSE 100* 07/24/2012   ALT 18 07/24/2012   AST 28 07/24/2012   NA 135 07/24/2012   K 3.8 07/24/2012   CL 101 07/24/2012   CREATININE 0.80 09/02/2013   BUN 16  09/02/2013   CO2 27 07/24/2012   INR 0.95 07/20/2012      Assessment / Plan:     Patient doing well following resection of 3 cm thymoma/ benign at the time of surgical resection WHO type AB- no evidence of recurrence after 27 months on follow-up CT scan of the chest , no change in small benign appearing right upper lobe lung nodules. With her previous smoking history and age will refer her to the lung cancer screening clinic for further CT scans/screening for lung cancer    The CT scan of the chest this year does note small thyroid nodules not previously noted, ultrasound of the thyroid was suggested- the patient is referred back to primary care for thyroid evaluation.  I'll plan to see the patient back  in one year.     Grace Isaac 11/24/2014 12:46 PM

## 2015-11-23 ENCOUNTER — Other Ambulatory Visit: Payer: Self-pay | Admitting: Acute Care

## 2015-11-23 DIAGNOSIS — Z87891 Personal history of nicotine dependence: Secondary | ICD-10-CM

## 2015-12-07 ENCOUNTER — Telehealth: Payer: Self-pay | Admitting: *Deleted

## 2015-12-08 NOTE — Telephone Encounter (Signed)
Patient called back to r/s her [place] Screening appt - Canceled appointment here and let her know we would call her back to r/s - pr

## 2015-12-08 NOTE — Telephone Encounter (Signed)
[name] [name] please advise where we can re schedule the pt for her lung cancer screening.  thanks

## 2015-12-14 ENCOUNTER — Telehealth: Payer: Self-pay | Admitting: Acute Care

## 2015-12-14 NOTE — Telephone Encounter (Signed)
Referral was canceled on 11/22/15 for the lung cancer screening program due to several unsuccessful attempts to contact pt. I sent notification to Dr. Everrett Coombe office on 11/22/15. Nothing further needed.

## 2015-12-21 ENCOUNTER — Encounter: Payer: Medicare PPO | Admitting: Acute Care

## 2015-12-21 ENCOUNTER — Inpatient Hospital Stay: Admission: RE | Admit: 2015-12-21 | Payer: Medicare PPO | Source: Ambulatory Visit

## 2015-12-22 NOTE — Telephone Encounter (Signed)
[name] [name] please advise if we can close this message.

## 2016-01-03 NOTE — Telephone Encounter (Signed)
CT and SDMV scheduled for 01/15/16. Nothing further needed.

## 2016-01-12 ENCOUNTER — Other Ambulatory Visit: Payer: Self-pay | Admitting: Acute Care

## 2016-01-12 DIAGNOSIS — R918 Other nonspecific abnormal finding of lung field: Secondary | ICD-10-CM

## 2016-01-15 ENCOUNTER — Encounter: Payer: Medicare PPO | Admitting: Acute Care

## 2016-01-15 ENCOUNTER — Ambulatory Visit
Admission: RE | Admit: 2016-01-15 | Discharge: 2016-01-15 | Disposition: A | Discharge status: Home / Self Care | Payer: Medicare Other | Source: Ambulatory Visit | Attending: Acute Care | Admitting: Acute Care

## 2016-01-15 ENCOUNTER — Inpatient Hospital Stay: Admission: RE | Admit: 2016-01-15 | Payer: Medicare PPO | Source: Ambulatory Visit

## 2016-01-15 DIAGNOSIS — R918 Other nonspecific abnormal finding of lung field: Secondary | ICD-10-CM

## 2016-02-02 ENCOUNTER — Encounter: Payer: Self-pay | Admitting: Acute Care

## 2016-02-02 ENCOUNTER — Telehealth: Payer: Self-pay | Admitting: Acute Care

## 2016-02-02 DIAGNOSIS — Z87891 Personal history of nicotine dependence: Secondary | ICD-10-CM

## 2016-02-02 NOTE — Telephone Encounter (Signed)
I have called [name] [name] with the results of her low-dose screening CT. Her scan was not read and lung RADS format. Impression however showed a stable exam with bilateral pulmonary nodules that are stable . No indication of new or progressive pulmonary nodules. Recommendation is for annual follow-up. I explained to her that we will order and schedule her follow-up scan for 12 months which should be in 9 / 2018. [name] verbalized understanding of the above and had no further questions at the time the call was completed. She has my contact information in the event she has questions in the future.

## 2016-02-08 ENCOUNTER — Other Ambulatory Visit: Payer: Self-pay | Admitting: Family Medicine

## 2016-02-08 DIAGNOSIS — Z1231 Encounter for screening mammogram for malignant neoplasm of breast: Secondary | ICD-10-CM

## 2016-02-19 ENCOUNTER — Ambulatory Visit: Payer: Medicare Other

## 2017-01-17 ENCOUNTER — Encounter: Payer: Self-pay | Admitting: Acute Care

## 2017-02-20 ENCOUNTER — Other Ambulatory Visit: Payer: Self-pay | Admitting: Family Medicine

## 2017-02-20 DIAGNOSIS — R109 Unspecified abdominal pain: Secondary | ICD-10-CM

## 2017-02-27 ENCOUNTER — Other Ambulatory Visit: Payer: Medicare Other

## 2017-03-04 ENCOUNTER — Ambulatory Visit
Admission: RE | Admit: 2017-03-04 | Discharge: 2017-03-04 | Disposition: A | Discharge status: Home / Self Care | Payer: Medicare Other | Source: Ambulatory Visit | Attending: Family Medicine | Admitting: Family Medicine

## 2017-03-04 DIAGNOSIS — R109 Unspecified abdominal pain: Secondary | ICD-10-CM

## 2017-03-11 ENCOUNTER — Other Ambulatory Visit: Payer: Self-pay | Admitting: Family Medicine

## 2017-03-11 DIAGNOSIS — R1011 Right upper quadrant pain: Secondary | ICD-10-CM

## 2017-03-24 ENCOUNTER — Encounter
Admission: RE | Admit: 2017-03-24 | Discharge: 2017-03-24 | Disposition: A | Discharge status: Home / Self Care | Payer: Medicare Other | Source: Ambulatory Visit | Attending: Family Medicine | Admitting: Family Medicine

## 2017-03-24 DIAGNOSIS — R1011 Right upper quadrant pain: Secondary | ICD-10-CM | POA: Diagnosis not present

## 2017-03-24 MED ORDER — TECHNETIUM TC 99M MEBROFENIN IV KIT
5.2360 | PACK | Freq: Once | INTRAVENOUS | Status: AC | PRN
  Administered 2017-03-24: 5.236 via INTRAVENOUS

## 2018-03-11 ENCOUNTER — Other Ambulatory Visit: Payer: Self-pay | Admitting: Family Medicine

## 2018-03-11 DIAGNOSIS — Z1231 Encounter for screening mammogram for malignant neoplasm of breast: Secondary | ICD-10-CM

## 2018-03-26 ENCOUNTER — Ambulatory Visit: Payer: Medicare Other

## 2018-04-02 ENCOUNTER — Ambulatory Visit
Admission: RE | Admit: 2018-04-02 | Discharge: 2018-04-02 | Disposition: A | Discharge status: Home / Self Care | Payer: Medicare Other | Source: Ambulatory Visit | Attending: Family Medicine | Admitting: Family Medicine

## 2018-04-02 DIAGNOSIS — Z1231 Encounter for screening mammogram for malignant neoplasm of breast: Secondary | ICD-10-CM

## 2020-04-04 ENCOUNTER — Other Ambulatory Visit: Payer: Self-pay | Admitting: Family Medicine

## 2020-04-04 DIAGNOSIS — Z1231 Encounter for screening mammogram for malignant neoplasm of breast: Secondary | ICD-10-CM

## 2020-06-11 ENCOUNTER — Other Ambulatory Visit: Payer: Self-pay

## 2020-06-11 ENCOUNTER — Encounter: Payer: Self-pay | Admitting: Emergency Medicine

## 2020-06-11 ENCOUNTER — Ambulatory Visit
Admission: EM | Admit: 2020-06-11 | Discharge: 2020-06-11 | Disposition: A | Discharge status: Home / Self Care | Payer: Medicare PPO | Attending: Family Medicine | Admitting: Family Medicine

## 2020-06-11 ENCOUNTER — Ambulatory Visit (INDEPENDENT_AMBULATORY_CARE_PROVIDER_SITE_OTHER): Payer: Medicare PPO

## 2020-06-11 DIAGNOSIS — S32010A Wedge compression fracture of first lumbar vertebra, initial encounter for closed fracture: Secondary | ICD-10-CM

## 2020-06-11 DIAGNOSIS — W07XXXA Fall from chair, initial encounter: Secondary | ICD-10-CM | POA: Diagnosis not present

## 2020-06-11 DIAGNOSIS — S32040A Wedge compression fracture of fourth lumbar vertebra, initial encounter for closed fracture: Secondary | ICD-10-CM | POA: Diagnosis not present

## 2020-06-11 DIAGNOSIS — S52124A Nondisplaced fracture of head of right radius, initial encounter for closed fracture: Secondary | ICD-10-CM | POA: Diagnosis not present

## 2020-06-11 DIAGNOSIS — S52121A Displaced fracture of head of right radius, initial encounter for closed fracture: Secondary | ICD-10-CM

## 2020-06-11 MED ORDER — HYDROCODONE-ACETAMINOPHEN 5-325 MG PO TABS: 1.0000 | ORAL_TABLET | Freq: Three times a day (TID) | ORAL | 0 refills | Status: AC | PRN

## 2020-06-11 NOTE — ED Provider Notes (Signed)
[facility]    CSN: [id number] Arrival date & time: 06/11/20  1348      History   Chief Complaint Chief Complaint  Patient presents with  . Fall  . Back Pain  . Arm Pain   HPI  75 year old female presents with the above complaint.  Patient states that she suffered a fall on Friday.  She states that she was standing on a stool and was trying to fix her TV.  She states that the TV subsequently fell and she tried to catch it which subsequently led to her fall.  She reports that she landed on her back.  She states that she is having low back pain and right elbow pain.  Pain 6/10 in severity.  Patient states that she is unable to hold a weight in her hand due to the pain.  Pain is primarily at the right elbow.  She reports midline lumbar spine pain.  No bruising.  No relieving factors.  Past Medical History:  Diagnosis Date  . Allergic rhinitis, cause unspecified   . Anxiety   . Depression   . Early cataracts, bilateral    Hx: of cataracts "not sure if both eyes"  . GERD (gastroesophageal reflux disease)   . H/O Sjogren's disease ([place])    Hx: of  . Lupus (systemic lupus erythematosus) ([place])   . Macular degeneration    Hx: of  . Pneumonia    Hx: of as a child  . Right ear pain   . Shortness of breath    Hx: of " for about a year since diagnosis of Lupus"  . Unspecified asthma(493.90)    "allergy related asthma"    Patient Active Problem List   Diagnosis Date Noted  . GERD (gastroesophageal reflux disease) 12/05/2013  . Alopecia 04/28/2012  . Generalized maculopapular rash 08/09/2011  . Tobacco abuse 08/09/2011  . Thymoma, benign 08/09/2011  . Leukopenia 08/09/2011  . Hyponatremia 08/09/2011  . Tachycardia 08/09/2011  . Lupus erythematosus 07/26/2011  . Allergic rhinitis due to pollen 06/22/2010  . EAR PAIN, RIGHT 10/16/2009  . SHOULDER PAIN, LEFT 10/10/2008  . UPPER RESPIRATORY INFECTION 03/29/2008  . Asthmatic bronchitis without complication  29/93/7169    Past Surgical History:  Procedure Laterality Date  . NO PAST SURGERIES    . THYMECTOMY  07/2012  . VIDEO ASSISTED THORACOSCOPY (VATS)/WEDGE RESECTION Left 07/22/2012   Procedure: VIDEO ASSISTED THORACOSCOPY (VATS)/WEDGE RESECTION;  Surgeon: Grace Isaac, MD;  Location: [facility];  Service: Thoracic;  Laterality: Left;  Marland Kitchen VIDEO BRONCHOSCOPY N/A 07/22/2012   Procedure: VIDEO BRONCHOSCOPY;  Surgeon: Grace Isaac, MD;  Location: [facility] OR;  Service: Thoracic;  Laterality: N/A;    OB History   No obstetric history on file.      Home Medications    Prior to Admission medications   Medication Sig Start Date End Date Taking? Authorizing Provider  finasteride (PROPECIA) 1 MG tablet Take 1 mg by mouth daily.   Yes [provider]  HYDROcodone-acetaminophen (NORCO/VICODIN) 5-325 MG tablet Take 1 tablet by mouth every 8 (eight) hours as needed. 06/11/20  Yes Jada Kuhnert G, DO  hydroxychloroquine (PLAQUENIL) 200 MG tablet Take 200 mg by mouth 2 (two) times daily. 02/01/20  Yes [provider]  LORazepam (ATIVAN) 0.5 MG tablet Take 0.5 mg by mouth 2 (two) times daily. 05/15/20  Yes [provider]  rosuvastatin (CRESTOR) 10 MG tablet Take 1 tablet by mouth daily. 04/12/20  Yes [provider]  sertraline (  ZOLOFT) 100 MG tablet Take 100 mg by mouth daily.   Yes [provider]  Vitamin D, Ergocalciferol, (DRISDOL) 50000 UNITS CAPS Take 50,000 Units by mouth.   Yes [provider]  diphenhydrAMINE (BENADRYL) 25 MG tablet Take 50 mg by mouth every 6 (six) hours as needed. For skin irritation    [provider]  loratadine (CLARITIN) 10 MG tablet Take 10 mg by mouth daily.  06/11/20  [provider]    Family History Family History  Problem Relation Age of Onset  . Heart disease Mother   . Heart disease Brother   . Breast cancer Neg Hx     Social History Social History   Tobacco Use  . Smoking status: Former  Smoker    Packs/day: 1.00    Years: 36.00    Pack years: 36.00    Types: Cigarettes    Start date: 11/21/1975    Quit date: 06/21/2011    Years since quitting: 8.9  . Smokeless tobacco: Never Used  Vaping Use  . Vaping Use: Never used  Substance Use Topics  . Alcohol use: Yes    Alcohol/week: 0.0 standard drinks    Comment: Occasionally  . Drug use: No     Allergies   Patient has no known allergies.   Review of Systems Review of Systems Per HPI  Physical Exam Triage Vital Signs ED Triage Vitals  Enc Vitals Group     BP 06/11/20 1400 (!) 151/83     Pulse Rate 06/11/20 1400 (!) 108     Resp 06/11/20 1400 14     Temp 06/11/20 1400 98.1 F (36.7 C)     Temp Source 06/11/20 1400 Oral     SpO2 06/11/20 1400 97 %     Weight 06/11/20 1357 170 lb (77.1 kg)     Height 06/11/20 1357 5\' 2"  (1.575 m)     Head Circumference --      Peak Flow --      Pain Score 06/11/20 1357 6     Pain Loc --      Pain Edu? --      Excl. in [place]? --    Updated Vital Signs BP (!) 151/83 (BP Location: Left Arm)   Pulse (!) 108   Temp 98.1 F (36.7 C) (Oral)   Resp 14   Ht 5\' 2"  (1.575 m)   Wt 77.1 kg   SpO2 97%   BMI 31.09 kg/m   Visual Acuity Right Eye Distance:   Left Eye Distance:   Bilateral Distance:    Right Eye Near:   Left Eye Near:    Bilateral Near:     Physical Exam Vitals and nursing note reviewed.  Constitutional:      General: She is not in acute distress.    Appearance: Normal appearance. She is not ill-appearing.  HENT:     Head: Normocephalic and atraumatic.  Eyes:     General:        Right eye: No discharge.        Left eye: No discharge.     Conjunctiva/sclera: Conjunctivae normal.  Cardiovascular:     Rate and Rhythm: Regular rhythm. Tachycardia present.  Pulmonary:     Effort: Pulmonary effort is normal.     Breath sounds: Normal breath sounds. No wheezing, rhonchi or rales.  Musculoskeletal:     Comments: No discrete tenderness over the lumbar  spine. Mild tenderness over the radial head of the right elbow.  Neurological:     Mental Status: She is alert.  Psychiatric:        Mood and Affect: Mood normal.        Behavior: Behavior normal.      UC Treatments / Results  Labs (all labs ordered are listed, but only abnormal results are displayed) Labs Reviewed - No data to display  EKG   Radiology DG Lumbar Spine Complete  Result Date: 06/11/2020 CLINICAL DATA:  Fall.  Pain. EXAM: LUMBAR SPINE - COMPLETE 4+ VIEW COMPARISON:  None. FINDINGS: There is an age-indeterminate compression fracture of L4 with concavity of the superior endplate. There is an age indeterminate compression fracture of L1 with anterior wedging. There is approximately 25% loss of anterior height. The remainder of the vertebral bodies are normal. No malalignment. Mild lower lumbar facet degenerative changes. Mild degenerative disc disease. No other acute abnormalities. IMPRESSION: Age indeterminate compression fractures of L1 and L4 as described above. It is unclear whether these compression fractures are chronic or acute. Recommend clinical correlation. CT or MRI could better evaluate. MRI would be best to evaluate acuity. Insert PRA call report Electronically Signed   By: Dorise Bullion III M.D   On: 06/11/2020 15:00   DG Elbow Complete Right  Result Date: 06/11/2020 CLINICAL DATA:  Pain after fall. EXAM: RIGHT ELBOW - COMPLETE 3+ VIEW COMPARISON:  None. FINDINGS: There is a fracture through the radial neck, just distal to the radial head. There is mild angulation seen. No other fractures. A joint effusion is noted. IMPRESSION: There is a fracture just distal to the radial head with a correlating joint effusion. Electronically Signed   By: Dorise Bullion III M.D   On: 06/11/2020 15:01   DG Forearm Right  Result Date: 06/11/2020 CLINICAL DATA:  Pain after fall EXAM: RIGHT FOREARM - 2 VIEW COMPARISON:  None. FINDINGS: There is a fracture through the base of the  radial head. No other fractures are identified. A joint effusion is seen in the elbow. IMPRESSION: Fracture through the base of the radial head with an elbow effusion also identified. Electronically Signed   By: Dorise Bullion III M.D   On: 06/11/2020 15:03    Procedures Procedures (including critical care time)  Medications Ordered in UC Medications - No data to display  Initial Impression / Assessment and Plan / UC Course  I have reviewed the triage vital signs and the nursing notes.  Pertinent labs & imaging results that were available during my care of the patient were reviewed by me and considered in my medical decision making (see chart for details).    75 year old female presents for evaluation after suffering a fall.  X-rays were obtained and were independently reviewed by me.  Interpretation: X-ray of the elbow and forearm revealed a nondisplaced fracture of the radial head.  X-ray of the lumbar spine revealed compression fractures of L1 and L4.  Undetermined acuity.  Patient was placed in a sling.  Advised to follow-up with orthopedics.  Vicodin as needed for pain.  Supportive care.  Final Clinical Impressions(s) / UC Diagnoses   Final diagnoses:  Closed nondisplaced fracture of head of right radius, initial encounter  Compression fracture of L1 vertebra, initial encounter (HCC)  Compression fracture of L4 lumbar vertebra, closed, initial encounter [facility])     Discharge Instructions     Rest. Ice.  Wear sling.  Please call [facility] [phone]) for an appt next week.   Take care  Dr. Lacinda Axon  ED Prescriptions    Medication Sig Dispense Auth. Provider   HYDROcodone-acetaminophen (NORCO/VICODIN) 5-325 MG tablet Take 1 tablet by mouth every 8 (eight) hours as needed. 15 tablet Thersa Salt G, DO     I have reviewed the PDMP during this encounter.   Coral Spikes, [place] 06/11/20 1558

## 2020-06-11 NOTE — ED Triage Notes (Signed)
Patient states that she fell on Friday after standing on a stool at her house.  Patient c/o pain in her right arm, lower back pain.

## 2020-06-11 NOTE — Discharge Instructions (Signed)
Rest. Ice.  Wear sling.  Please call [place] [phone]) for an appt next week.   Take care  Dr. Lacinda Axon

## 2021-04-10 ENCOUNTER — Other Ambulatory Visit: Payer: Self-pay | Admitting: Family Medicine

## 2021-04-10 DIAGNOSIS — S32010A Wedge compression fracture of first lumbar vertebra, initial encounter for closed fracture: Secondary | ICD-10-CM

## 2021-04-11 ENCOUNTER — Other Ambulatory Visit: Payer: Self-pay

## 2021-04-11 ENCOUNTER — Ambulatory Visit
Admission: RE | Admit: 2021-04-11 | Discharge: 2021-04-11 | Disposition: A | Discharge status: Home / Self Care | Payer: Medicare PPO | Source: Ambulatory Visit | Attending: Family Medicine | Admitting: Family Medicine

## 2021-04-11 DIAGNOSIS — S32010A Wedge compression fracture of first lumbar vertebra, initial encounter for closed fracture: Secondary | ICD-10-CM | POA: Diagnosis not present

## 2022-01-29 IMAGING — MR MR LUMBAR SPINE W/O CM
4 of 5 series · 25 of 48 positions shown · non-contrast
Comparison: Lumbar radiographs 06/11/2020.

CLINICAL DATA: 75-year-old female status post fall in [REDACTED] with
subsequent low back pain. Occasional leg pain.

EXAM:
MRI LUMBAR SPINE WITHOUT CONTRAST
TECHNIQUE: Multiplanar, multisequence MR imaging of the lumbar spine was
performed. No intravenous contrast was administered.

[Series 2: T2 · sagittal · 4.0mm · 0.81mm/px · 6 of 15 slices shown (1 of 2)]
[im 1/15]
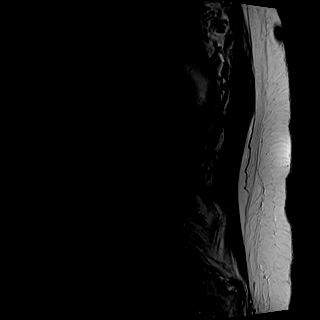
[im 3/15]
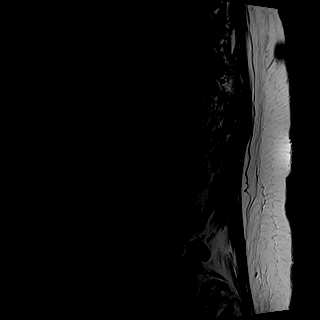
[im 6/15]
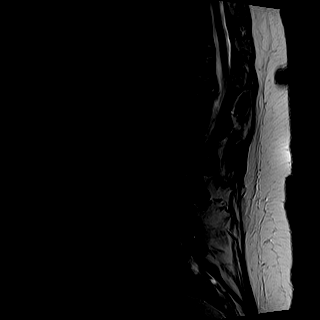
[im 9/15]
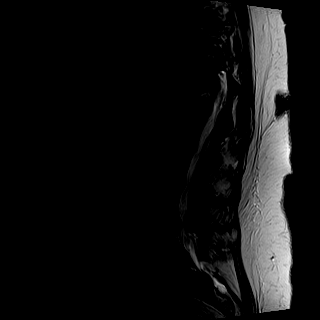
[im 12/15]
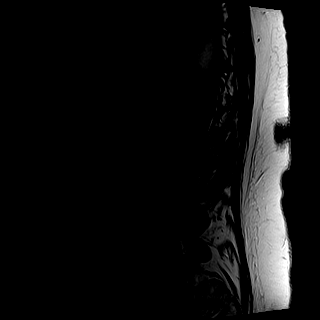
[im 15/15]
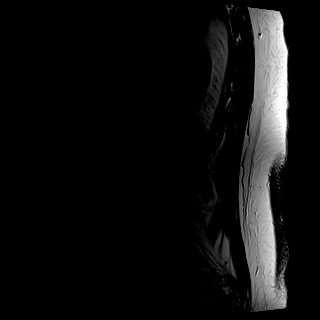

[Series 3: T1 · sagittal · 4.0mm · 0.41mm/px · 6 of 15 slices shown (1 of 2)]
[im 1/15]
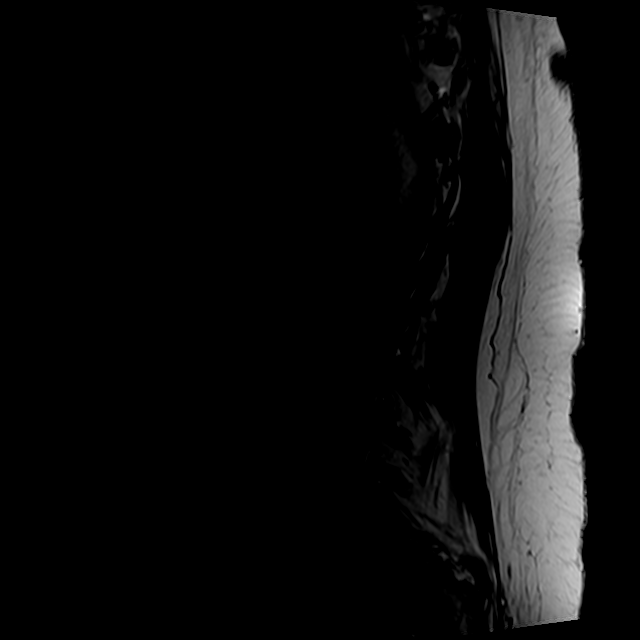
[im 3/15]
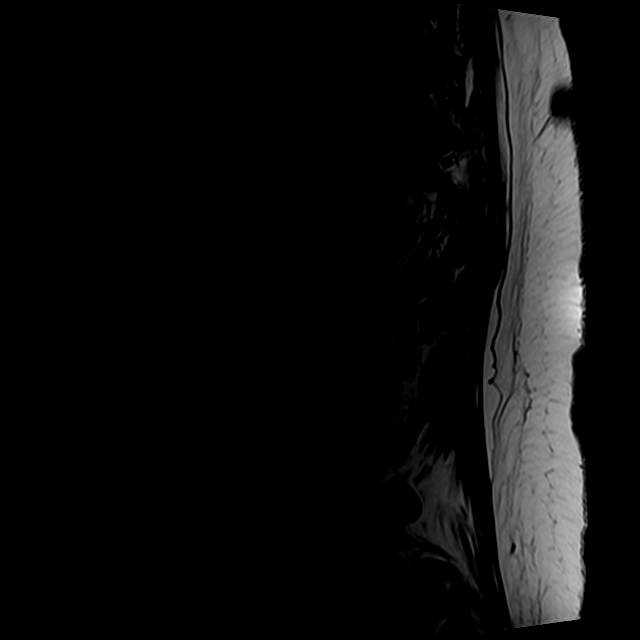
[im 6/15]
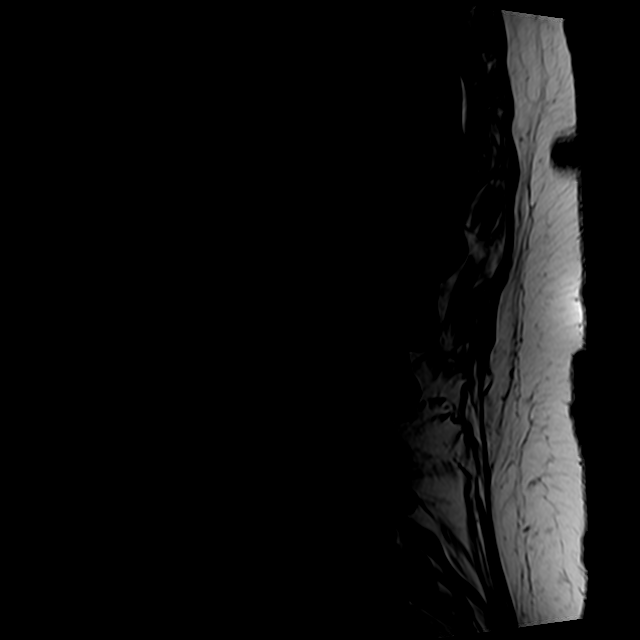
[im 9/15]
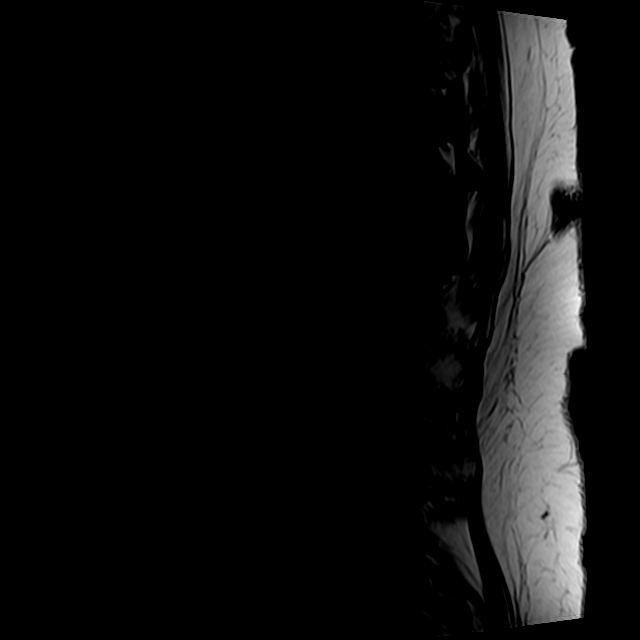
[im 12/15]
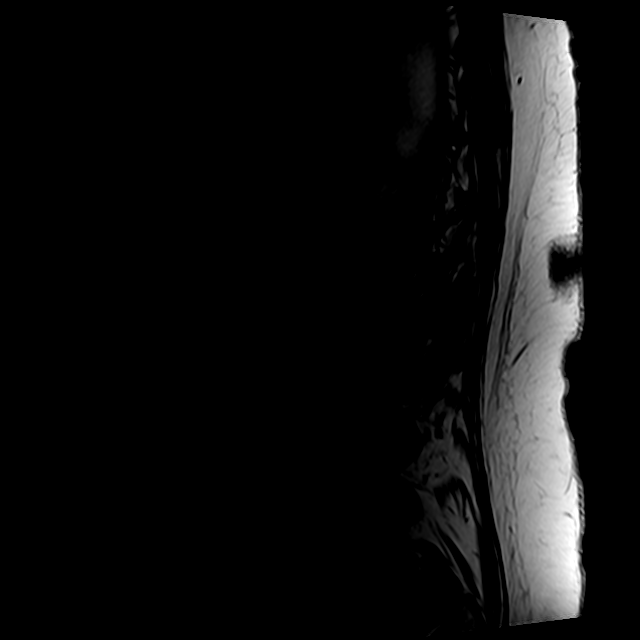
[im 15/15]
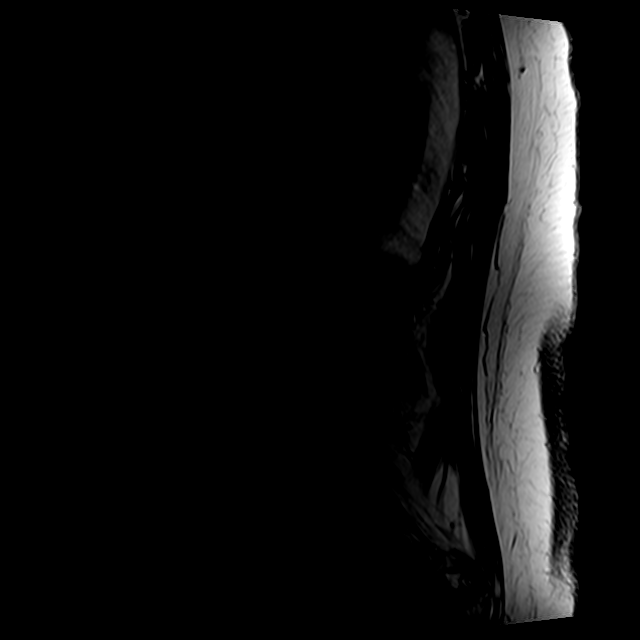

[Series 5: T2 · axial · 4.0mm · 0.78mm/px · z∈[-61,+150]mm · 9 of 41 slices shown (2 of 2)]
[im 1/41]
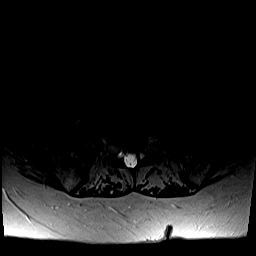
[im 6/41]
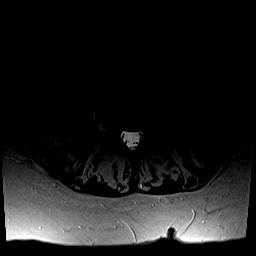
[im 12/41]
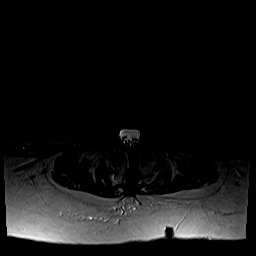
[im 18/41]
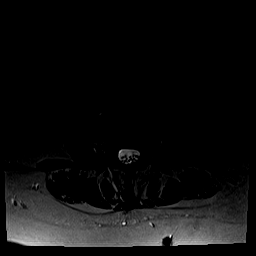
[im 21/41]
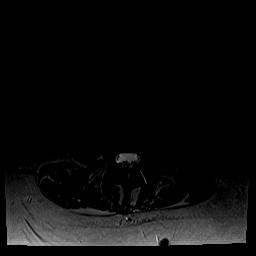
[im 23/41]
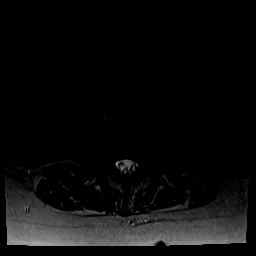
[im 29/41]
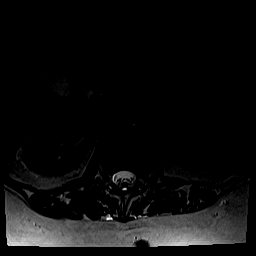
[im 35/41]
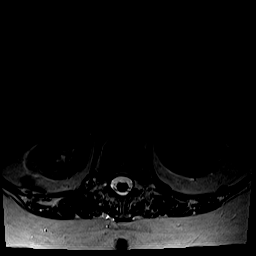
[im 41/41]
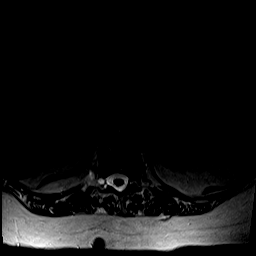

[Series 6: T1 · axial · 4.0mm · 0.39mm/px · z∈[-61,+120]mm · 4 of 41 slices shown (2 of 2)]
[im 1/41]
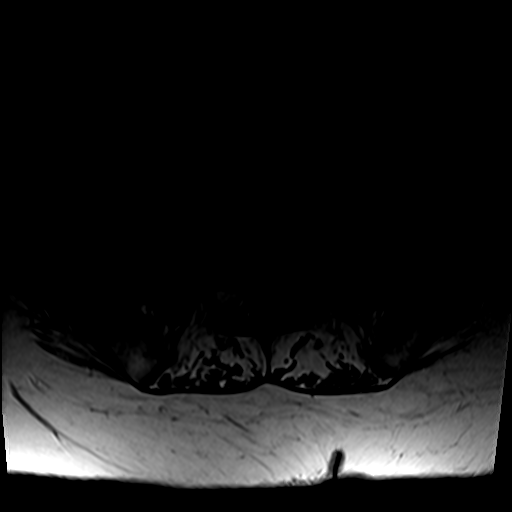
[im 6/41]
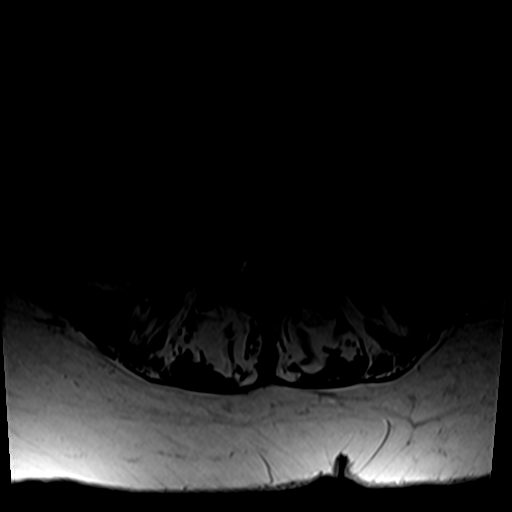
[im 21/41]
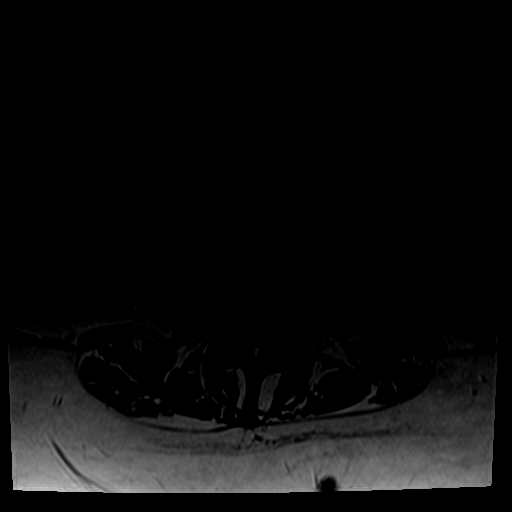
[im 35/41]
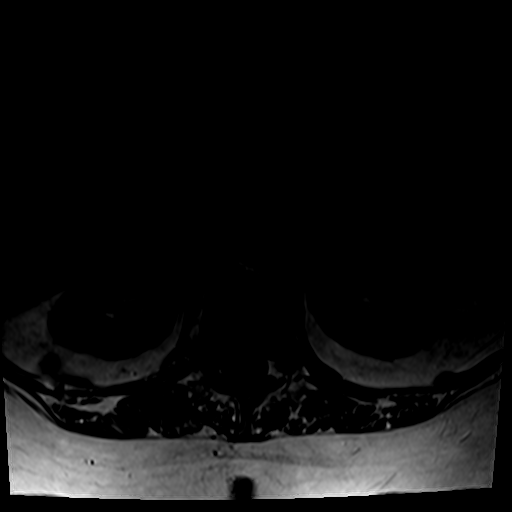

[25 of 48 positions shown; findings below may reference images not displayed]

FINDINGS: Segmentation:  Normal.

Alignment: Stable lumbar lordosis since [REDACTED]. Subtle
anterolisthesis of L4 on L5.

Vertebrae: Normal background bone marrow signal. Superior endplate
compression fractures of L1 and L4 appear chronic, although there is
faint superior endplate marrow edema at L1 most pronounced on series
4, image 8. On axial images this resembles a Schmorl's node. Mild
retropulsion of the posterosuperior endplate at that level, see
additional T12-L1 details below. No other marrow edema or evidence
of acute osseous abnormality. And preserved lumbar vertebral height
elsewhere. Intact visible sacrum.

Conus medullaris and cauda equina: Conus extends to the L1-L2 level.
No lower spinal cord or conus signal abnormality.

Paraspinal and other soft tissues: Negative.

Disc levels:

T10-T11 and T11-T12 are negative.

T12-L1: Anterior disc space loss with mild retropulsion of bone and
posterior disc bulging. Borderline to mild spinal stenosis just
above the level of the conus. No conus mass effect. No foraminal
involvement or stenosis.

L1-L2:  Minor disc bulging.  No stenosis.

L2-L3:  Minor disc bulging.  No stenosis.

L3-L4: Mild disc bulging most affecting the neural foramina. No
spinal stenosis. Mild L3 foraminal stenosis greater on the right.

L4-L5: Mild disc bulging and small but broad-based central disc
protrusion on series 5, image 32. Mild to moderate facet and
ligament flavum hypertrophy. Mild bilateral lateral recess stenosis
(L5 nerve levels) but no spinal stenosis. There is mild to moderate
left L4 foraminal stenosis.

L5-S1: Minor disc bulging. Small annular fissure of the disc at the
left lateral recess. Mild facet hypertrophy. No stenosis.
IMPRESSION: 1. Probable acute Schmorl's nodes superimposed on chronic L1
superior endplate compression. Mild associated endplate marrow
edema. And mild chronic retropulsion of bone contributing to
borderline to mild spinal stenosis at T12-L1 near the conus
medullaris. No conus mass effect or signal abnormality.

2. No other acute osseous abnormality.  Chronic L4 compression.

3. Subtle anterolisthesis at L4-L5 with disc and posterior element
degeneration results in mild bilateral lateral recess stenosis and
mild to moderate left foraminal stenosis. But mild for age lumbar
spine degeneration elsewhere. No other lumbar spinal stenosis.

## 2023-10-08 ENCOUNTER — Other Ambulatory Visit (HOSPITAL_COMMUNITY): Payer: Self-pay
